# Patient Record
Sex: Male | Born: 1955 | Race: White | Hispanic: No | Smoking: Current every day smoker
Health system: Southern US, Community
[De-identification: ages and names within clinical notes are randomized; demographics above are authoritative.]

## PROBLEM LIST (undated history)

## (undated) DIAGNOSIS — I10 Essential (primary) hypertension: Secondary | ICD-10-CM

## (undated) DIAGNOSIS — E119 Type 2 diabetes mellitus without complications: Secondary | ICD-10-CM

## (undated) DIAGNOSIS — J449 Chronic obstructive pulmonary disease, unspecified: Secondary | ICD-10-CM

---

## 1997-12-24 ENCOUNTER — Ambulatory Visit (HOSPITAL_COMMUNITY): Admission: RE | Admit: 1997-12-24 | Discharge: 1997-12-24 | Payer: Self-pay | Admitting: Internal Medicine

## 1998-10-01 ENCOUNTER — Encounter: Payer: Self-pay | Admitting: Internal Medicine

## 1998-10-01 ENCOUNTER — Ambulatory Visit (HOSPITAL_COMMUNITY): Admission: RE | Admit: 1998-10-01 | Discharge: 1998-10-01 | Payer: Self-pay | Admitting: Internal Medicine

## 2000-03-03 ENCOUNTER — Ambulatory Visit (HOSPITAL_COMMUNITY): Admission: RE | Admit: 2000-03-03 | Discharge: 2000-03-03 | Payer: Self-pay | Admitting: Internal Medicine

## 2000-03-03 ENCOUNTER — Encounter: Payer: Self-pay | Admitting: Internal Medicine

## 2005-03-09 ENCOUNTER — Emergency Department (HOSPITAL_COMMUNITY): Admission: EM | Admit: 2005-03-09 | Discharge: 2005-03-09 | Payer: Self-pay | Admitting: Emergency Medicine

## 2005-03-09 ENCOUNTER — Emergency Department (HOSPITAL_COMMUNITY): Admission: EM | Admit: 2005-03-09 | Discharge: 2005-03-09 | Payer: Self-pay | Admitting: *Deleted

## 2006-05-01 ENCOUNTER — Emergency Department (HOSPITAL_COMMUNITY): Admission: EM | Admit: 2006-05-01 | Discharge: 2006-05-01 | Payer: Self-pay | Admitting: Emergency Medicine

## 2006-06-27 ENCOUNTER — Emergency Department (HOSPITAL_COMMUNITY): Admission: EM | Admit: 2006-06-27 | Discharge: 2006-06-27 | Payer: Self-pay | Admitting: Emergency Medicine

## 2007-07-26 ENCOUNTER — Emergency Department (HOSPITAL_COMMUNITY): Admission: EM | Admit: 2007-07-26 | Discharge: 2007-07-27 | Payer: Self-pay | Admitting: Emergency Medicine

## 2010-02-14 ENCOUNTER — Emergency Department (HOSPITAL_COMMUNITY): Admission: EM | Admit: 2010-02-14 | Discharge: 2010-02-15 | Payer: Self-pay | Admitting: Emergency Medicine

## 2010-02-27 ENCOUNTER — Emergency Department (HOSPITAL_COMMUNITY): Admission: EM | Admit: 2010-02-27 | Discharge: 2010-02-28 | Payer: Self-pay | Admitting: Emergency Medicine

## 2010-03-01 ENCOUNTER — Inpatient Hospital Stay (HOSPITAL_COMMUNITY)
Admission: EM | Admit: 2010-03-01 | Discharge: 2010-03-05 | Disposition: A | Discharge status: Other Acute Inpatient Hospital | Payer: Self-pay | Source: Home / Self Care | Admitting: Emergency Medicine

## 2010-03-05 ENCOUNTER — Inpatient Hospital Stay (HOSPITAL_COMMUNITY): Admission: AD | Admit: 2010-03-05 | Discharge: 2010-03-10 | Payer: Self-pay | Admitting: Psychiatry

## 2010-03-05 ENCOUNTER — Ambulatory Visit: Payer: Self-pay | Admitting: Psychiatry

## 2010-07-30 LAB — CULTURE, BLOOD (ROUTINE X 2): Culture  Setup Time: 201110171953

## 2010-07-30 LAB — CBC
HCT: 35 % — ABNORMAL LOW (ref 39.0–52.0)
HCT: 41.4 % (ref 39.0–52.0)
HCT: 42.7 % (ref 39.0–52.0)
Hemoglobin: 12 g/dL — ABNORMAL LOW (ref 13.0–17.0)
Hemoglobin: 14.9 g/dL (ref 13.0–17.0)
Hemoglobin: 15.3 g/dL (ref 13.0–17.0)
MCH: 30.3 pg (ref 26.0–34.0)
MCH: 30.7 pg (ref 26.0–34.0)
MCH: 31.7 pg (ref 26.0–34.0)
MCHC: 33.3 g/dL (ref 30.0–36.0)
MCHC: 33.3 g/dL (ref 30.0–36.0)
MCHC: 34.3 g/dL (ref 30.0–36.0)
MCHC: 34.5 g/dL (ref 30.0–36.0)
MCV: 90.9 fL (ref 78.0–100.0)
MCV: 92.2 fL (ref 78.0–100.0)
Platelets: 290 10*3/uL (ref 150–400)
Platelets: 347 10*3/uL (ref 150–400)
RBC: 3.86 MIL/uL — ABNORMAL LOW (ref 4.22–5.81)
RBC: 3.87 MIL/uL — ABNORMAL LOW (ref 4.22–5.81)
RBC: 4.79 MIL/uL (ref 4.22–5.81)
RDW: 14.4 % (ref 11.5–15.5)
WBC: 14.8 10*3/uL — ABNORMAL HIGH (ref 4.0–10.5)
WBC: 16.9 10*3/uL — ABNORMAL HIGH (ref 4.0–10.5)
WBC: 9.7 10*3/uL (ref 4.0–10.5)

## 2010-07-30 LAB — COMPREHENSIVE METABOLIC PANEL
ALT: 12 U/L (ref 0–53)
ALT: 19 U/L (ref 0–53)
AST: 39 U/L — ABNORMAL HIGH (ref 0–37)
AST: 59 U/L — ABNORMAL HIGH (ref 0–37)
Albumin: 3.5 g/dL (ref 3.5–5.2)
Alkaline Phosphatase: 69 U/L (ref 39–117)
Alkaline Phosphatase: 73 U/L (ref 39–117)
Alkaline Phosphatase: 55 U/L (ref 39–117)
BUN: 34 mg/dL — ABNORMAL HIGH (ref 6–23)
BUN: 12 mg/dL (ref 6–23)
CO2: 13 mEq/L — ABNORMAL LOW (ref 19–32)
CO2: 22 mEq/L (ref 19–32)
CO2: 26 mEq/L (ref 19–32)
Chloride: 103 mEq/L (ref 96–112)
Chloride: 121 mEq/L — ABNORMAL HIGH (ref 96–112)
Chloride: 107 mEq/L (ref 96–112)
GFR calc Af Amer: >60 mL/min (ref >60)
GFR calc non Af Amer: 42 mL/min — ABNORMAL LOW (ref >60)
GFR calc non Af Amer: >60 mL/min (ref >60)
Glucose, Bld: 109 mg/dL — ABNORMAL HIGH (ref 70–99)
Glucose, Bld: 128 mg/dL — ABNORMAL HIGH (ref 70–99)
Potassium: 3.5 mEq/L (ref 3.5–5.1)
Potassium: 4.6 mEq/L (ref 3.5–5.1)
Potassium: 3.2 mEq/L — ABNORMAL LOW (ref 3.5–5.1)
Sodium: 135 mEq/L (ref 135–145)
Sodium: 150 mEq/L — ABNORMAL HIGH (ref 135–145)
Total Bilirubin: 1.1 mg/dL (ref 0.3–1.2)
Total Bilirubin: 0.4 mg/dL (ref 0.3–1.2)
Total Protein: 8.3 g/dL (ref 6.0–8.3)

## 2010-07-30 LAB — DIFFERENTIAL
Basophils Absolute: 0 10*3/uL (ref 0.0–0.1)
Basophils Absolute: 0 10*3/uL (ref 0.0–0.1)
Basophils Relative: 0 % (ref 0–1)
Basophils Relative: 0 % (ref 0–1)
Eosinophils Absolute: 0 10*3/uL (ref 0.0–0.7)
Eosinophils Absolute: 0 10*3/uL (ref 0.0–0.7)
Eosinophils Relative: 0 % (ref 0–5)
Eosinophils Relative: 0 % (ref 0–5)
Eosinophils Relative: 1 % (ref 0–5)
Lymphocytes Relative: 8 % — ABNORMAL LOW (ref 12–46)
Lymphs Abs: 4.6 10*3/uL — ABNORMAL HIGH (ref 0.7–4.0)
Monocytes Absolute: 1.3 10*3/uL — ABNORMAL HIGH (ref 0.1–1.0)
Monocytes Absolute: 1.3 10*3/uL — ABNORMAL HIGH (ref 0.1–1.0)
Monocytes Relative: 8 % (ref 3–12)
Neutro Abs: 10.3 10*3/uL — ABNORMAL HIGH (ref 1.7–7.7)
Neutrophils Relative %: 78 % — ABNORMAL HIGH (ref 43–77)

## 2010-07-30 LAB — CARDIAC PANEL(CRET KIN+CKTOT+MB+TROPI)
CK, MB: 28 ng/mL (ref 0.3–4.0)
Relative Index: 1.6 (ref 0.0–2.5)
Total CK: 1756 U/L — ABNORMAL HIGH (ref 7–232)

## 2010-07-30 LAB — URINE DRUGS OF ABUSE SCREEN W ALC, ROUTINE (REF LAB)
Benzodiazepines.: NEGATIVE
Cocaine Metabolites: NEGATIVE
Creatinine,U: 162.1 mg/dL
Ethyl Alcohol: <10 mg/dL (ref <10)
Opiate Screen, Urine: NEGATIVE
Phencyclidine (PCP): NEGATIVE

## 2010-07-30 LAB — SALICYLATE LEVEL: Salicylate Lvl: <4 mg/dL (ref 2.8–20.0)

## 2010-07-30 LAB — GLUCOSE, CAPILLARY
Glucose-Capillary: 103 mg/dL — ABNORMAL HIGH (ref 70–99)
Glucose-Capillary: 107 mg/dL — ABNORMAL HIGH (ref 70–99)
Glucose-Capillary: 110 mg/dL — ABNORMAL HIGH (ref 70–99)
Glucose-Capillary: 127 mg/dL — ABNORMAL HIGH (ref 70–99)
Glucose-Capillary: 145 mg/dL — ABNORMAL HIGH (ref 70–99)
Glucose-Capillary: 163 mg/dL — ABNORMAL HIGH (ref 70–99)

## 2010-07-30 LAB — URINALYSIS, ROUTINE W REFLEX MICROSCOPIC
Glucose, UA: NEGATIVE mg/dL
Hgb urine dipstick: NEGATIVE
Protein, ur: NEGATIVE mg/dL
Specific Gravity, Urine: 1.016 (ref 1.005–1.030)
Urobilinogen, UA: 0.2 mg/dL (ref 0.0–1.0)

## 2010-07-30 LAB — BASIC METABOLIC PANEL
BUN: 6 mg/dL (ref 6–23)
CO2: 24 mEq/L (ref 19–32)
Calcium: 8.3 mg/dL — ABNORMAL LOW (ref 8.4–10.5)
Chloride: 107 mEq/L (ref 96–112)
Chloride: 111 mEq/L (ref 96–112)
Creatinine, Ser: 0.79 mg/dL (ref 0.4–1.5)
Creatinine, Ser: 0.96 mg/dL (ref 0.4–1.5)
GFR calc non Af Amer: >60 mL/min (ref >60)
GFR calc non Af Amer: >60 mL/min (ref >60)
Glucose, Bld: 114 mg/dL — ABNORMAL HIGH (ref 70–99)
Glucose, Bld: 90 mg/dL (ref 70–99)
Potassium: 3 mEq/L — ABNORMAL LOW (ref 3.5–5.1)
Potassium: 3.2 mEq/L — ABNORMAL LOW (ref 3.5–5.1)
Sodium: 138 mEq/L (ref 135–145)

## 2010-07-30 LAB — RAPID URINE DRUG SCREEN, HOSP PERFORMED
Amphetamines: NOT DETECTED
Amphetamines: NOT DETECTED
Barbiturates: NOT DETECTED
Barbiturates: NOT DETECTED
Benzodiazepines: POSITIVE — AB
Tetrahydrocannabinol: NOT DETECTED

## 2010-07-30 LAB — VITAMIN B12: Vitamin B-12: 340 pg/mL (ref 211–911)

## 2010-07-30 LAB — CK TOTAL AND CKMB (NOT AT ARMC)
CK, MB: 25.2 ng/mL (ref 0.3–4.0)
Relative Index: 2.5 (ref 0.0–2.5)
Total CK: 1180 U/L — ABNORMAL HIGH (ref 7–232)

## 2010-07-30 LAB — RPR: RPR Ser Ql: NONREACTIVE

## 2010-07-30 LAB — TSH: TSH: 1.132 u[IU]/mL (ref 0.350–4.500)

## 2010-07-30 LAB — CK: Total CK: 1622 U/L — ABNORMAL HIGH (ref 7–232)

## 2010-07-30 LAB — GAMMA GT: GGT: 27 U/L (ref 7–51)

## 2010-07-30 LAB — ETHANOL: Alcohol, Ethyl (B): <5 mg/dL (ref 0–10)

## 2011-02-09 LAB — COMPREHENSIVE METABOLIC PANEL
BUN: 6
CO2: 28
Calcium: 9.2
Creatinine, Ser: 0.94
GFR calc non Af Amer: >60
Glucose, Bld: 101 — ABNORMAL HIGH

## 2011-02-09 LAB — URINE MICROSCOPIC-ADD ON

## 2011-02-09 LAB — URINALYSIS, ROUTINE W REFLEX MICROSCOPIC
Leukocytes, UA: NEGATIVE
Nitrite: NEGATIVE
Specific Gravity, Urine: 1.019
Urobilinogen, UA: 0.2

## 2011-12-11 ENCOUNTER — Emergency Department (HOSPITAL_BASED_OUTPATIENT_CLINIC_OR_DEPARTMENT_OTHER): Payer: Medicaid Other

## 2011-12-11 ENCOUNTER — Encounter (HOSPITAL_BASED_OUTPATIENT_CLINIC_OR_DEPARTMENT_OTHER): Payer: Self-pay | Admitting: Emergency Medicine

## 2011-12-11 ENCOUNTER — Emergency Department (HOSPITAL_BASED_OUTPATIENT_CLINIC_OR_DEPARTMENT_OTHER)
Admission: EM | Admit: 2011-12-11 | Discharge: 2011-12-11 | Disposition: A | Discharge status: Home / Self Care | Payer: Medicaid Other | Attending: Emergency Medicine | Admitting: Emergency Medicine

## 2011-12-11 DIAGNOSIS — Y92410 Unspecified street and highway as the place of occurrence of the external cause: Secondary | ICD-10-CM | POA: Insufficient documentation

## 2011-12-11 DIAGNOSIS — R413 Other amnesia: Secondary | ICD-10-CM | POA: Insufficient documentation

## 2011-12-11 DIAGNOSIS — F17200 Nicotine dependence, unspecified, uncomplicated: Secondary | ICD-10-CM | POA: Insufficient documentation

## 2011-12-11 DIAGNOSIS — S060X9A Concussion with loss of consciousness of unspecified duration, initial encounter: Secondary | ICD-10-CM

## 2011-12-11 DIAGNOSIS — S060XAA Concussion with loss of consciousness status unknown, initial encounter: Secondary | ICD-10-CM | POA: Insufficient documentation

## 2011-12-11 DIAGNOSIS — I10 Essential (primary) hypertension: Secondary | ICD-10-CM | POA: Insufficient documentation

## 2011-12-11 HISTORY — DX: Essential (primary) hypertension: I10

## 2011-12-11 LAB — CBC WITH DIFFERENTIAL/PLATELET
Basophils Relative: 1 % (ref 0–1)
Eosinophils Absolute: 0.2 10*3/uL (ref 0.0–0.7)
Eosinophils Relative: 1 % (ref 0–5)
Hemoglobin: 11.9 g/dL — ABNORMAL LOW (ref 13.0–17.0)
MCH: 30.2 pg (ref 26.0–34.0)
MCHC: 34.1 g/dL (ref 30.0–36.0)
MCV: 88.6 fL (ref 78.0–100.0)
Monocytes Relative: 10 % (ref 3–12)
Neutrophils Relative %: 65 % (ref 43–77)

## 2011-12-11 LAB — RAPID URINE DRUG SCREEN, HOSP PERFORMED
Barbiturates: NOT DETECTED
Cocaine: NOT DETECTED
Tetrahydrocannabinol: NOT DETECTED

## 2011-12-11 LAB — COMPREHENSIVE METABOLIC PANEL
ALT: 9 U/L (ref 0–53)
AST: 29 U/L (ref 0–37)
Albumin: 3.6 g/dL (ref 3.5–5.2)
CO2: 29 mEq/L (ref 19–32)
Chloride: 98 mEq/L (ref 96–112)
GFR calc non Af Amer: 59 mL/min — ABNORMAL LOW (ref >90)
Sodium: 137 mEq/L (ref 135–145)
Total Bilirubin: 0.3 mg/dL (ref 0.3–1.2)

## 2011-12-11 LAB — AMMONIA: Ammonia: 49 umol/L (ref 11–60)

## 2011-12-11 LAB — ETHANOL: Alcohol, Ethyl (B): <11 mg/dL (ref 0–11)

## 2011-12-11 MED ORDER — SODIUM CHLORIDE 0.9 % IV SOLN
1000.0000 mL | INTRAVENOUS | Status: DC
  Administered 2011-12-11: 1000 mL via INTRAVENOUS

## 2011-12-11 NOTE — ED Notes (Signed)
MD at bedside.-Dr. Knapp 
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
## 2013-01-24 ENCOUNTER — Emergency Department (HOSPITAL_COMMUNITY)
Admission: EM | Admit: 2013-01-24 | Discharge: 2013-01-24 | Disposition: A | Discharge status: Home / Self Care | Payer: Medicaid Other | Source: Home / Self Care | Attending: Family Medicine | Admitting: Family Medicine

## 2013-01-24 ENCOUNTER — Encounter (HOSPITAL_COMMUNITY): Payer: Self-pay | Admitting: Emergency Medicine

## 2013-01-24 ENCOUNTER — Emergency Department (INDEPENDENT_AMBULATORY_CARE_PROVIDER_SITE_OTHER): Payer: Medicaid Other

## 2013-01-24 DIAGNOSIS — S92919A Unspecified fracture of unspecified toe(s), initial encounter for closed fracture: Secondary | ICD-10-CM

## 2013-01-24 DIAGNOSIS — S92911A Unspecified fracture of right toe(s), initial encounter for closed fracture: Secondary | ICD-10-CM

## 2013-01-24 MED ORDER — HYDROCODONE-ACETAMINOPHEN 5-325 MG PO TABS: 1.0000 | ORAL_TABLET | Freq: Four times a day (QID) | ORAL | Status: DC | PRN

## 2013-01-24 NOTE — ED Provider Notes (Addendum)
CSN: [id number]     Arrival date & time 01/24/13  1001 History   None    Chief Complaint  Patient presents with  . Foot Injury    injury to right great toe 3 a.m this morning.    (Consider location/radiation/quality/duration/timing/severity/associated sxs/prior Treatment) Patient is a 57 y.o. male presenting with foot injury. The history is provided by the patient.  Foot Injury Location:  Toe Time since incident:  7 hours Injury: yes   Mechanism of injury: crush   Crush injury:    Mechanism: dropped heavy glass table on toe. Toe location:  R big toe Pain details:    Quality:  Throbbing   Severity:  Moderate Chronicity:  New Dislocation: no   Foreign body present:  No foreign bodies Tetanus status:  Up to date   Past Medical History  Diagnosis Date  . Hypertension    History reviewed. No pertinent past surgical history. History reviewed. No pertinent family history. History  Substance Use Topics  . Smoking status: Current Every Day Smoker -- 1.00 packs/day  . Smokeless tobacco: Not on file  . Alcohol Use: No    Review of Systems  Constitutional: Negative.   Musculoskeletal: Positive for joint swelling and gait problem.  Skin: Positive for wound.    Allergies  Review of patient's allergies indicates no known allergies.  Home Medications   Current Outpatient Rx  Name  Route  Sig  Dispense  Refill  . HYDROcodone-acetaminophen (NORCO/VICODIN) 5-325 MG per tablet   Oral   Take 1 tablet by mouth every 6 (six) hours as needed for pain.   6 tablet   1    BP 129/87  Pulse 88  Temp(Src) 98.2 F (36.8 C) (Oral)  Resp 20  SpO2 95% Physical Exam  Nursing note and vitals reviewed. Constitutional: He is oriented to person, place, and time. He appears well-developed and well-nourished.  Musculoskeletal: He exhibits tenderness.       Feet:  Neurological: He is alert and oriented to person, place, and time.  Skin: Skin is warm and dry.    ED Course  Procedures  (including critical care time) Labs Review Labs Reviewed - No data to display Imaging Review Dg Foot Complete Right  01/24/2013   *RADIOLOGY REPORT*  Clinical Data: Pain post trauma  RIGHT FOOT COMPLETE - 3+ VIEW  Comparison: None.  Findings: Frontal, oblique, lateral views were obtained.  There is an obliquely oriented fracture along the medial proximal portion of the first distal phalanx in near anatomic alignment.  This obliquely oriented fracture does extend into the first IP joint. There is also a similar appearing fracture along the lateral aspect of the proximal portion of the first distal phalanx in near anatomic alignment.  There are no other fractures.  No dislocation. There is moderate narrowing of all PIP and DIP joints.  There is a spur arising from the inferior calcaneus.  There is a small exostosis arising from the dorsal distal talus.  IMPRESSION: Fractures of the medial and lateral aspects of the proximal portion of the first distal phalanx.  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  Inferior calcaneal spur.  No dislocation.   Original Report Authenticated By: Bretta Bang, M.D.    MDM  X-rays reviewed and report per radiologist.     Linna Hoff, MD 01/24/13 1124  Linna Hoff, MD 01/25/13 2036

## 2013-01-24 NOTE — ED Notes (Signed)
Reports dropping glass table top on right foot. Having pain in right great toe/brusing and ankle swelling.  Incident happened 3 a.m this morning. Pt has not taking any otc meds for pain.

## 2013-01-24 NOTE — ED Notes (Signed)
Pt is up to date on tdap 

## 2013-06-25 ENCOUNTER — Telehealth (HOSPITAL_COMMUNITY): Payer: Self-pay | Admitting: *Deleted

## 2013-06-25 ENCOUNTER — Emergency Department (HOSPITAL_COMMUNITY)
Admission: EM | Admit: 2013-06-25 | Discharge: 2013-06-25 | Disposition: A | Discharge status: Home / Self Care | Payer: Medicaid Other | Attending: Emergency Medicine | Admitting: Emergency Medicine

## 2013-06-25 DIAGNOSIS — F19939 Other psychoactive substance use, unspecified with withdrawal, unspecified: Principal | ICD-10-CM | POA: Insufficient documentation

## 2013-06-25 DIAGNOSIS — R61 Generalized hyperhidrosis: Secondary | ICD-10-CM | POA: Insufficient documentation

## 2013-06-25 DIAGNOSIS — F17200 Nicotine dependence, unspecified, uncomplicated: Secondary | ICD-10-CM | POA: Insufficient documentation

## 2013-06-25 DIAGNOSIS — F411 Generalized anxiety disorder: Secondary | ICD-10-CM | POA: Insufficient documentation

## 2013-06-25 DIAGNOSIS — Z79899 Other long term (current) drug therapy: Secondary | ICD-10-CM | POA: Insufficient documentation

## 2013-06-25 DIAGNOSIS — F1193 Opioid use, unspecified with withdrawal: Secondary | ICD-10-CM

## 2013-06-25 DIAGNOSIS — I10 Essential (primary) hypertension: Secondary | ICD-10-CM | POA: Insufficient documentation

## 2013-06-25 DIAGNOSIS — R000 Tachycardia, unspecified: Secondary | ICD-10-CM | POA: Insufficient documentation

## 2013-06-25 DIAGNOSIS — R1084 Generalized abdominal pain: Secondary | ICD-10-CM | POA: Insufficient documentation

## 2013-06-25 DIAGNOSIS — F13939 Sedative, hypnotic or anxiolytic use, unspecified with withdrawal, unspecified: Secondary | ICD-10-CM

## 2013-06-25 DIAGNOSIS — R197 Diarrhea, unspecified: Secondary | ICD-10-CM | POA: Insufficient documentation

## 2013-06-25 DIAGNOSIS — F1123 Opioid dependence with withdrawal: Secondary | ICD-10-CM

## 2013-06-25 DIAGNOSIS — F13239 Sedative, hypnotic or anxiolytic dependence with withdrawal, unspecified: Secondary | ICD-10-CM

## 2013-06-25 MED ORDER — ALPRAZOLAM 1 MG PO TABS: 1.0000 mg | ORAL_TABLET | Freq: Two times a day (BID) | ORAL | Status: DC

## 2013-06-25 MED ORDER — SODIUM CHLORIDE 0.9 % IV BOLUS (SEPSIS)
1000.0000 mL | Freq: Once | INTRAVENOUS | Status: AC
  Administered 2013-06-25: 1000 mL via INTRAVENOUS

## 2013-06-25 MED ORDER — ALPRAZOLAM 0.5 MG PO TABS
1.0000 mg | ORAL_TABLET | Freq: Once | ORAL | Status: AC
  Administered 2013-06-25: 1 mg via ORAL
  Filled 2013-06-25: qty 4

## 2013-06-25 MED ORDER — ONDANSETRON HCL 4 MG PO TABS: 4.0000 mg | ORAL_TABLET | Freq: Four times a day (QID) | ORAL | Status: DC

## 2013-06-25 MED ORDER — CLONIDINE HCL 0.2 MG PO TABS: 0.2000 mg | ORAL_TABLET | Freq: Two times a day (BID) | ORAL | Status: DC

## 2013-06-25 MED ORDER — ONDANSETRON HCL 4 MG/2ML IJ SOLN
4.0000 mg | Freq: Once | INTRAMUSCULAR | Status: AC
  Administered 2013-06-25: 4 mg via INTRAVENOUS
  Filled 2013-06-25: qty 2

## 2013-06-25 MED ORDER — CLONIDINE HCL 0.1 MG PO TABS
0.2000 mg | ORAL_TABLET | Freq: Once | ORAL | Status: AC
  Administered 2013-06-25: 0.2 mg via ORAL
  Filled 2013-06-25: qty 2
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
## 2013-06-25 NOTE — Discharge Instructions (Signed)
Benzodiazepine Withdrawal  °Benzodiazepines are a group of drugs that are prescribed for both short-term and long-term treatment of a variety of medical conditions. For some of these conditions, such as seizures and sudden and severe muscle spasms, they are used only for a few hours or a few days. For other conditions, such as anxiety, sleep problems, or frequent muscle spasms or to help prevent seizures, they are used for an extended period, usually weeks or months. °Benzodiazepines work by changing the way your brain functions. Normally, chemicals in your brain called neurotransmitters send messages between your brain cells. The neurotransmitter that benzodiazepines affect is called gamma-aminobutyric acid (GABA). GABA sends out messages that have a calming effect on many of the functions of your brain. Benzodiazepines make these messages stronger and increase this calming effect. °Short-term use of benzodiazepines usually does not cause problems when you stop taking the drugs. However, if you take benzodiazepines for a long time, your body can adjust to the drug and require more of it to produce the same effect (drug tolerance). Eventually, you can develop physical dependence on benzodiazepines, which is when you experience negative effects if your dosage of benzodiazepines is reduced or stopped too quickly. These negative effects are called symptoms of withdrawal. °SYMPTOMS °Symptoms of withdrawal may begin anytime within the first 10 days after you stop taking the benzodiazepine. They can last from several weeks up to a few months but usually are the worst between the first 10 to 14 days.  °The actual symptoms also vary, depending on the type of benzodiazepine you take. Possible symptoms include: °· Anxiety. °· Excitability. °· Irritability. °· Depression. °· Mood swings. °· Trouble sleeping. °· Confusion. °· Uncontrollable shaking (tremors). °· Muscle weakness. °· Seizures. °DIAGNOSIS °To diagnose  benzodiazepine withdrawal, your caregiver will examine you for certain signs, such as: °· Rapid heartbeat. °· Rapid breathing. °· Tremors. °· High blood pressure. °· Fever. °· Mood changes. °Your caregiver also may ask the following questions about your use of benzodiazepines: °· What type of benzodiazepine did you take? °· How much did you take each day? °· How long did you take the drug? °· When was the last time you took the drug? °· Do you take any other drugs? °· Have you had alcohol recently? °· Have you had a seizure recently? °· Have you lost consciousness recently? °· Have you had trouble remembering recent events? °· Have you had a recent increase in anxiety, irritability, or trouble sleeping? °A drug test also may be administered. °TREATMENT °The treatment for benzodiazepine withdrawal can vary, depending on the type and severity of your symptoms, what type of benzodiazepine you have been taking, and how long you have been taking the benzodiazepine. Sometimes it is necessary for you to be treated in a hospital, especially if you are at risk of seizures.  °Often, treatment includes a prescription for a long-acting benzodiazepine, the dosage of which is reduced slowly over a long period. This period could be several weeks or months. Eventually, your dosage will be reduced to a point that you can stop taking the drug, without experiencing withdrawal symptoms. This is called tapered withdrawal. Occasionally, minor symptoms of withdrawal continue for a few days or weeks after you have completed a tapered withdrawal. °SEEK IMMEDIATE MEDICAL CARE IF: °· You have a seizure. °· You develop a craving for drugs or alcohol. °· You begin to experience symptoms of withdrawal during your tapered withdrawal. °· You become very confused. °· You lose consciousness. °· You   have trouble breathing.  You think about hurting yourself or someone else. Document Released: 04/23/2011 Document Revised: 07/27/2011 Document  Reviewed: 04/23/2011 [facility] Patient Information 2014 [place], [place]. Narcotic Withdrawal If you take narcotic drugs for a long time, you may become dependent on them. Stopping these medicines suddenly can cause physical symptoms of withdrawal. Narcotics include opiate prescription pain medicines and heroin. Commonly prescribed narcotics include codeine, hydrocodone, oxycodone, methadone, and morphine. SYMPTOMS  Narcotics tend to slow down body and mental function. When you quit taking narcotics, your body and mind are stimulated. Some withdrawal symptoms include:  Irritability.  Anxiety.  Runny nose.  "Goose flesh."  Diarrhea.  Nausea.  Muscle spasms.  Sleeplessness.  Chills.  Sweats.  Drug cravings.  Confusion. Withdrawal symptoms are troubling. The severity depends on:  Your body's make up.  The amount of drugs you used.  The length of time you used them. You may be at greater risk of having twitching and shaking (seizure) during the first several days of withdrawal from sedative drugs, including narcotics. However, opiate withdrawal rarely causes a seizure. Withdrawal is uncomfortable, but it is not life-threatening for adults unless there is a medical complication, such as heart disease. HOME CARE INSTRUCTIONS   Drink fluids, get plenty of rest, and take hot baths.  Medicines may be prescribed to help control withdrawal symptoms.  Over-the-counter medicines may be helpful to control diarrhea or an upset stomach.  If your problems resulted from taking prescription pain medicines, make sure you have a follow-up visit with your caregiver within the next few days. Be open about this problem.  If you are dependent or addicted to street drugs, contact a local drug and alcohol treatment center or Narcotics Anonymous.  Have someone with you to monitor your symptoms.  Engage in healthy activities with friends who do not use drugs.  Stay away from the drug  scene. SEEK IMMEDIATE MEDICAL CARE IF:   You have vomiting that cannot be controlled, especially if you cannot keep liquids down.  You are seeing things or hearing voices that are not really there (hallucinating).  You have a seizure. Document Released: 06/11/2004 Document Revised: 07/27/2011 Document Reviewed: 10/04/2009 [facility] Patient Information 2014 [place], [place].
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
## 2016-01-29 ENCOUNTER — Emergency Department (HOSPITAL_COMMUNITY): Payer: Medicaid Other

## 2016-01-29 ENCOUNTER — Emergency Department (HOSPITAL_COMMUNITY)
Admission: EM | Admit: 2016-01-29 | Discharge: 2016-01-29 | Disposition: A | Discharge status: Home / Self Care | Payer: Medicaid Other | Attending: Emergency Medicine | Admitting: Emergency Medicine

## 2016-01-29 ENCOUNTER — Encounter (HOSPITAL_COMMUNITY): Payer: Self-pay | Admitting: *Deleted

## 2016-01-29 DIAGNOSIS — Z7984 Long term (current) use of oral hypoglycemic drugs: Secondary | ICD-10-CM | POA: Insufficient documentation

## 2016-01-29 DIAGNOSIS — G8929 Other chronic pain: Secondary | ICD-10-CM | POA: Diagnosis not present

## 2016-01-29 DIAGNOSIS — Z79899 Other long term (current) drug therapy: Secondary | ICD-10-CM | POA: Insufficient documentation

## 2016-01-29 DIAGNOSIS — M25562 Pain in left knee: Secondary | ICD-10-CM | POA: Insufficient documentation

## 2016-01-29 DIAGNOSIS — M545 Low back pain: Secondary | ICD-10-CM | POA: Diagnosis present

## 2016-01-29 DIAGNOSIS — F17210 Nicotine dependence, cigarettes, uncomplicated: Secondary | ICD-10-CM | POA: Insufficient documentation

## 2016-01-29 DIAGNOSIS — I10 Essential (primary) hypertension: Secondary | ICD-10-CM | POA: Diagnosis not present

## 2016-01-29 DIAGNOSIS — R200 Anesthesia of skin: Secondary | ICD-10-CM | POA: Insufficient documentation

## 2016-01-29 DIAGNOSIS — M5136 Other intervertebral disc degeneration, lumbar region: Secondary | ICD-10-CM | POA: Diagnosis not present

## 2016-01-29 NOTE — ED Notes (Signed)
Please see triage note

## 2016-01-29 NOTE — ED Notes (Signed)
Pt left without his dc instructions

## 2016-01-29 NOTE — ED Triage Notes (Addendum)
Pt reports L leg pain and swelling and back pain for more than 2 weeks.  States he went to see his doctor Monday-was given a referral to a diff doctor for an xray.  Still waiting for a call from them.  Pt reports the swelling in his L leg is more in his knee.  Pt ambulates with a cane without a difficulty.  Was able to lift both legs up onto the stretcher to lay down.  Pt states his knee feels sore also
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
## 2016-01-29 NOTE — ED Notes (Signed)
Patient transported to X-ray 

## 2016-02-08 ENCOUNTER — Emergency Department (HOSPITAL_COMMUNITY)
Admission: EM | Admit: 2016-02-08 | Discharge: 2016-02-08 | Disposition: A | Discharge status: Home / Self Care | Payer: Medicaid Other | Attending: Emergency Medicine | Admitting: Emergency Medicine

## 2016-02-08 ENCOUNTER — Encounter (HOSPITAL_COMMUNITY): Payer: Self-pay

## 2016-02-08 DIAGNOSIS — Z7984 Long term (current) use of oral hypoglycemic drugs: Secondary | ICD-10-CM | POA: Diagnosis not present

## 2016-02-08 DIAGNOSIS — I10 Essential (primary) hypertension: Secondary | ICD-10-CM | POA: Insufficient documentation

## 2016-02-08 DIAGNOSIS — T2122XA Burn of second degree of abdominal wall, initial encounter: Secondary | ICD-10-CM | POA: Insufficient documentation

## 2016-02-08 DIAGNOSIS — T2121XA Burn of second degree of chest wall, initial encounter: Secondary | ICD-10-CM | POA: Diagnosis not present

## 2016-02-08 DIAGNOSIS — F17210 Nicotine dependence, cigarettes, uncomplicated: Secondary | ICD-10-CM | POA: Insufficient documentation

## 2016-02-08 DIAGNOSIS — Y990 Civilian activity done for income or pay: Secondary | ICD-10-CM | POA: Diagnosis not present

## 2016-02-08 DIAGNOSIS — T22231A Burn of second degree of right upper arm, initial encounter: Secondary | ICD-10-CM | POA: Diagnosis not present

## 2016-02-08 DIAGNOSIS — T2000XA Burn of unspecified degree of head, face, and neck, unspecified site, initial encounter: Secondary | ICD-10-CM | POA: Insufficient documentation

## 2016-02-08 DIAGNOSIS — Y9389 Activity, other specified: Secondary | ICD-10-CM | POA: Diagnosis not present

## 2016-02-08 DIAGNOSIS — X12XXXA Contact with other hot fluids, initial encounter: Secondary | ICD-10-CM | POA: Diagnosis not present

## 2016-02-08 DIAGNOSIS — Y92009 Unspecified place in unspecified non-institutional (private) residence as the place of occurrence of the external cause: Secondary | ICD-10-CM | POA: Diagnosis not present

## 2016-02-08 DIAGNOSIS — IMO0002 Reserved for concepts with insufficient information to code with codable children: Secondary | ICD-10-CM

## 2016-02-08 MED ORDER — BACITRACIN ZINC 500 UNIT/GM EX OINT: 1.0000 "application " | TOPICAL_OINTMENT | Freq: Two times a day (BID) | CUTANEOUS | 0 refills | Status: DC

## 2016-02-08 MED ORDER — FLUORESCEIN SODIUM 1 MG OP STRP
1.0000 | ORAL_STRIP | Freq: Once | OPHTHALMIC | Status: AC
  Administered 2016-02-08: 1 via OPHTHALMIC
  Filled 2016-02-08: qty 1

## 2016-02-08 MED ORDER — SILVER SULFADIAZINE 1 % EX CREA
TOPICAL_CREAM | Freq: Once | CUTANEOUS | Status: AC
  Administered 2016-02-08: 11:00:00 via TOPICAL
  Filled 2016-02-08: qty 85

## 2016-02-08 MED ORDER — SILVER SULFADIAZINE 1 % EX CREA: 1.0000 "application " | TOPICAL_CREAM | Freq: Two times a day (BID) | CUTANEOUS | 0 refills | Status: AC

## 2016-02-08 MED ORDER — BACITRACIN ZINC 500 UNIT/GM EX OINT
TOPICAL_OINTMENT | Freq: Once | CUTANEOUS | Status: AC
  Administered 2016-02-08: 1 via TOPICAL
  Filled 2016-02-08: qty 0.9
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
## 2016-02-08 NOTE — ED Notes (Signed)
Pt A&OX4, ambulatory at d/c with steady gait. Verbalized understanding of wound care and follow up care.

## 2016-03-22 ENCOUNTER — Encounter (HOSPITAL_COMMUNITY): Payer: Self-pay | Admitting: Emergency Medicine

## 2016-03-22 ENCOUNTER — Emergency Department (HOSPITAL_COMMUNITY)
Admission: EM | Admit: 2016-03-22 | Discharge: 2016-03-22 | Disposition: A | Discharge status: Home / Self Care | Payer: Medicaid Other | Attending: Emergency Medicine | Admitting: Emergency Medicine

## 2016-03-22 DIAGNOSIS — F17210 Nicotine dependence, cigarettes, uncomplicated: Secondary | ICD-10-CM | POA: Insufficient documentation

## 2016-03-22 DIAGNOSIS — F419 Anxiety disorder, unspecified: Secondary | ICD-10-CM | POA: Diagnosis not present

## 2016-03-22 DIAGNOSIS — Z79899 Other long term (current) drug therapy: Secondary | ICD-10-CM | POA: Insufficient documentation

## 2016-03-22 DIAGNOSIS — I10 Essential (primary) hypertension: Secondary | ICD-10-CM | POA: Diagnosis not present

## 2016-03-22 DIAGNOSIS — R440 Auditory hallucinations: Secondary | ICD-10-CM | POA: Insufficient documentation

## 2016-03-22 DIAGNOSIS — R443 Hallucinations, unspecified: Secondary | ICD-10-CM

## 2016-03-22 DIAGNOSIS — Z7984 Long term (current) use of oral hypoglycemic drugs: Secondary | ICD-10-CM | POA: Diagnosis not present

## 2016-03-22 MED ORDER — AMLODIPINE BESYLATE 5 MG PO TABS: 10.0000 mg | ORAL_TABLET | Freq: Every day | ORAL | Status: DC

## 2016-03-22 MED ORDER — HYDROXYZINE HCL 25 MG PO TABS: 25.0000 mg | ORAL_TABLET | Freq: Four times a day (QID) | ORAL | Status: DC | PRN

## 2016-03-22 MED ORDER — LORAZEPAM 1 MG PO TABS
1.0000 mg | ORAL_TABLET | Freq: Once | ORAL | Status: AC
  Administered 2016-03-22: 1 mg via ORAL
  Filled 2016-03-22: qty 1

## 2016-03-22 MED ORDER — METFORMIN HCL ER 500 MG PO TB24
500.0000 mg | ORAL_TABLET | Freq: Every day | ORAL | Status: DC
  Administered 2016-03-22: 500 mg via ORAL
  Filled 2016-03-22: qty 1

## 2016-03-22 MED ORDER — HYDROXYZINE HCL 25 MG PO TABS: 25.0000 mg | ORAL_TABLET | Freq: Four times a day (QID) | ORAL | 0 refills | Status: DC | PRN

## 2016-03-22 MED ORDER — CLONIDINE HCL 0.1 MG PO TABS: 0.2000 mg | ORAL_TABLET | Freq: Two times a day (BID) | ORAL | Status: DC

## 2016-03-22 NOTE — ED Notes (Signed)
Bed: WA29 Expected date:  Expected time:  Means of arrival:  Comments: 

## 2016-03-22 NOTE — ED Triage Notes (Addendum)
Per EMS- Pt has been having hallucinations. He is aware that these are hallucinations and not real. He hasn't been on his Xanax since last Saturday. He has a history of TBI. Denies SI or HI.

## 2016-03-22 NOTE — ED Notes (Signed)
Contacted lab and they have no blood for this patient.
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
## 2016-03-22 NOTE — Progress Notes (Signed)
CSW spoke with patient at bedside. CSW informed patient that psychiatrist recommended that patient follow up with outpatient treatment. CSW provided patient with outpatient resource handout. CSW inquired if patient had any additional questions, patient replied no.

## 2016-03-22 NOTE — ED Notes (Signed)
Pt medically cleared after Dr. Randal Buba received lab results

## 2016-03-22 NOTE — Progress Notes (Signed)
Patient denies suicidal/homicidal ideations or alcohol/drug abuse.  He reports his hallucinations stopped when he came to the ED, vague auditory hallucinations prior to admission.  Mr. Douglas Melendez ran out of his Xanax over a week ago which he reports he has taken for over twenty years but cannot remember his MD's name.  Moved from [place] two [place] half years ago and has a MD but cannot remember his name or diagnoses.  Stable for discharge.  Waylan Boga, PMH-NP
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
## 2016-03-23 ENCOUNTER — Emergency Department (HOSPITAL_COMMUNITY)
Admission: EM | Admit: 2016-03-23 | Discharge: 2016-03-24 | Disposition: A | Discharge status: Home / Self Care | Payer: Medicaid Other | Attending: Emergency Medicine | Admitting: Emergency Medicine

## 2016-03-23 ENCOUNTER — Encounter (HOSPITAL_COMMUNITY): Payer: Self-pay | Admitting: Emergency Medicine

## 2016-03-23 DIAGNOSIS — I10 Essential (primary) hypertension: Secondary | ICD-10-CM | POA: Insufficient documentation

## 2016-03-23 DIAGNOSIS — Z7984 Long term (current) use of oral hypoglycemic drugs: Secondary | ICD-10-CM | POA: Insufficient documentation

## 2016-03-23 DIAGNOSIS — F17210 Nicotine dependence, cigarettes, uncomplicated: Secondary | ICD-10-CM | POA: Insufficient documentation

## 2016-03-23 DIAGNOSIS — F19982 Other psychoactive substance use, unspecified with psychoactive substance-induced sleep disorder: Secondary | ICD-10-CM

## 2016-03-23 DIAGNOSIS — F10239 Alcohol dependence with withdrawal, unspecified: Secondary | ICD-10-CM | POA: Diagnosis present

## 2016-03-23 DIAGNOSIS — F10982 Alcohol use, unspecified with alcohol-induced sleep disorder: Secondary | ICD-10-CM | POA: Insufficient documentation

## 2016-03-23 DIAGNOSIS — E876 Hypokalemia: Secondary | ICD-10-CM | POA: Insufficient documentation

## 2016-03-23 LAB — CBC WITH DIFFERENTIAL/PLATELET
BASOS ABS: 0.1 10*3/uL (ref 0.0–0.1)
BASOS PCT: 0 %
Eosinophils Absolute: 0 10*3/uL (ref 0.0–0.7)
Eosinophils Relative: 0 %
HEMATOCRIT: 45.8 % (ref 39.0–52.0)
HEMOGLOBIN: 16.2 g/dL (ref 13.0–17.0)
LYMPHS PCT: 27 %
Lymphs Abs: 4 10*3/uL (ref 0.7–4.0)
MCH: 31.2 pg (ref 26.0–34.0)
MCHC: 35.4 g/dL (ref 30.0–36.0)
MCV: 88.2 fL (ref 78.0–100.0)
MONO ABS: 1 10*3/uL (ref 0.1–1.0)
Monocytes Relative: 6 %
NEUTROS ABS: 10.1 10*3/uL — AB (ref 1.7–7.7)
NEUTROS PCT: 67 %
Platelets: 306 10*3/uL (ref 150–400)
RBC: 5.19 MIL/uL (ref 4.22–5.81)
RDW: 13.3 % (ref 11.5–15.5)
WBC: 15.2 10*3/uL — ABNORMAL HIGH (ref 4.0–10.5)

## 2016-03-23 LAB — CBG MONITORING, ED: Glucose-Capillary: 231 mg/dL — ABNORMAL HIGH (ref 65–99)

## 2016-03-23 NOTE — ED Provider Notes (Signed)
[facility] DEPT Provider Note   CSN: [id number] Arrival date & time: 03/23/16  2203  By signing my name below, I, Delton Prairie, attest that this documentation has been prepared under the direction and in the presence of Merryl Hacker, MD  Electronically Signed: Delton Prairie, ED Scribe. 03/23/16. 12:16 AM.  History   Chief Complaint Chief Complaint  Patient presents with  . Withdrawal   The history is provided by the patient. No language interpreter was used.   HPI Comments:  Douglas Melendez is a 60 y.o. male, with a hx of HTN, who presents to the Emergency Department complaining of difficulty sleeping x 1 week. Pt states he has run out of his Xanax which is why he is having trouble going to sleep. 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 Pt notes he last took Xanax 1 week ago and a dose of Suboxone 12 hours ago. He has not tried any outpatient programs and denies any SI, HI, alcohol use and drug use. Pt visited [facility] on 03/22/16 for similar symptoms.   Patient was seen at [facility] long hospital yesterday. He was evaluated by TTS. He was given outpatient resources. He does report intermittent hallucinations but denies any at this time. Denies any physical complaints. Reports that he has not seen his primary physician. Per triage notes, patient was aggressive in triage. His EKG was notably tachycardic but he was very agitated and angry.  Past Medical History:  Diagnosis Date  . Hypertension     Patient Active Problem List   Diagnosis Date Noted  . Anxiety 03/22/2016    History reviewed. No pertinent surgical history.   Home Medications    Prior to Admission medications   Medication Sig Start Date End Date Taking? Authorizing Provider  ALPRAZolam Duanne Moron) 1 MG tablet Take 1 tablet (1 mg total) by mouth 2 (two) times daily. 06/25/13   Ernestina Patches, MD  amLODipine (NORVASC) 10 MG tablet Take 10 mg by mouth daily.    Historical Provider, MD  hydrOXYzine (ATARAX/VISTARIL) 25 MG  tablet Take 1 tablet (25 mg total) by mouth every 6 (six) hours as needed for anxiety. 03/22/16   Patrecia Pour, NP  lisinopril (PRINIVIL,ZESTRIL) 40 MG tablet Take by mouth. 01/27/16 01/26/17  Historical Provider, MD  metFORMIN (GLUCOPHAGE) 1000 MG tablet Take by mouth. 01/27/16 01/26/17  Historical Provider, MD  potassium chloride 20 MEQ TBCR Take 20 mEq by mouth daily. 03/24/16   Merryl Hacker, MD  tamsulosin (FLOMAX) 0.4 MG CAPS capsule Take by mouth. 01/27/16   Historical Provider, MD  traZODone (DESYREL) 50 MG tablet Take 1 tablet (50 mg total) by mouth at bedtime. 03/24/16   Merryl Hacker, MD    Family History History reviewed. No pertinent family history.  Social History Social History  Substance Use Topics  . Smoking status: Current Every Day Smoker    Packs/day: 1.00    Types: Cigarettes  . Smokeless tobacco: Never Used  . Alcohol use No     Allergies   Patient has no known allergies.   Review of Systems Review of Systems  Respiratory: Negative for shortness of breath.   Cardiovascular: Negative for chest pain.  Psychiatric/Behavioral: Positive for sleep disturbance. Negative for suicidal ideas.  All other systems reviewed and are negative.    Physical Exam Updated Vital Signs BP 156/95   Pulse 86   Temp 98.3 F (36.8 C) (Oral)   Resp 13   SpO2 90%   Physical Exam  Constitutional: He is oriented to person, place, and time. He appears well-developed and well-nourished.  Cooperative.  HENT:  Head: Normocephalic and atraumatic.  Cardiovascular: Normal rate and normal heart sounds.   No murmur heard. Tachycardic into the 100s  Pulmonary/Chest: Effort normal and breath sounds normal. No respiratory distress. He has no wheezes.  Abdominal: Soft. There is no tenderness.  Musculoskeletal: He exhibits no edema.  Neurological: He is alert and oriented to person, place, and time.  Skin: Skin is warm and dry.  Psychiatric:  Cooperative, difficulty with eye  contact  Nursing note and vitals reviewed.    ED Treatments / Results  DIAGNOSTIC STUDIES:  Oxygen Saturation is 93% on RA, adequate by my interpretation.    COORDINATION OF CARE:  12:04 AM Discussed treatment plan with pt at bedside and pt agreed to plan.  Labs (all labs ordered are listed, but only abnormal results are displayed) Labs Reviewed  CBC WITH DIFFERENTIAL/PLATELET - Abnormal; Notable for the following:       Result Value   WBC 15.2 (*)    Neutro Abs 10.1 (*)    All other components within normal limits  COMPREHENSIVE METABOLIC PANEL - Abnormal; Notable for the following:    Potassium 2.9 (*)    Glucose, Bld 196 (*)    Total Protein 8.7 (*)    AST 46 (*)    All other components within normal limits  URINALYSIS, ROUTINE W REFLEX MICROSCOPIC (NOT AT [facility])    EKG  EKG Interpretation  Date/Time:  Monday March 23 2016 22:12:40 EST Ventricular Rate:  155 PR Interval:  120 QRS Duration: 94 QT Interval:  312 QTC Calculation: 501 R Axis:   -20 Text Interpretation:  Sinus tachycardia Cannot rule out Inferior infarct , age undetermined Abnormal ECG Confirmed by Dina Rich  MD, [place] ([id number]) on 03/24/2016 1:13:29 AM       Radiology No results found.  Procedures Procedures (including critical care time)  Medications Ordered in ED Medications  potassium chloride SA (K-DUR,KLOR-CON) CR tablet 40 mEq (40 mEq Oral Given 03/24/16 0106)     Initial Impression / Assessment and Plan / ED Course  I have reviewed the triage vital signs and the nursing notes.  Pertinent labs & imaging results that were available during my care of the patient were reviewed by me and considered in my medical decision making (see chart for details).  Clinical Course     Patient presents continuing to complain of insomnia. Was seen and evaluated at [facility] long for the same as well as requesting Xanax for sleep. He was evaluated by behavioral health at that time as he is having  intermittent hallucinations. Outpatient resources provided. Patient has not followed up. Reports the Vistaril is not helping. He is nontoxic. Initially tachycardic in triage but was reportedly very agitated. Currently heart rate 90s to 100s. He is otherwise asymptomatic. No tremors. He is then also an extra one week. Feel he is out of the window of acute withdrawal. Will provide trazodone for sleep. Additionally, patient noted to be hypokalemic. This was replaced. Follow-up with primary physician for recheck. Additional community resources provided.  After history, exam, and medical workup I feel the patient has been appropriately medically screened and is safe for discharge home. Pertinent diagnoses were discussed with the patient. Patient was given return precautions.   Final Clinical Impressions(s) / ED Diagnoses   Final diagnoses:  Drug induced insomnia (HCC)  Hypokalemia    New Prescriptions New Prescriptions   POTASSIUM  CHLORIDE 20 MEQ TBCR    Take 20 mEq by mouth daily.   TRAZODONE (DESYREL) 50 MG TABLET    Take 1 tablet (50 mg total) by mouth at bedtime.   I personally performed the services described in this documentation, which was scribed in my presence. The recorded information has been reviewed and is accurate.     Merryl Hacker, MD 03/24/16 [phone]

## 2016-03-23 NOTE — ED Triage Notes (Signed)
Pt st's he has been on xanax since the 1980's  Has rx for 3 times a day.  Pt st's has not had any in 7 days.  Pt very anxious, hands shaking, very diaphoretic
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
## 2016-03-24 LAB — COMPREHENSIVE METABOLIC PANEL
ALBUMIN: 4.5 g/dL (ref 3.5–5.0)
ALK PHOS: 87 U/L (ref 38–126)
ALT: 29 U/L (ref 17–63)
AST: 46 U/L — ABNORMAL HIGH (ref 15–41)
Anion gap: 14 (ref 5–15)
BILIRUBIN TOTAL: 0.8 mg/dL (ref 0.3–1.2)
BUN: 14 mg/dL (ref 6–20)
CALCIUM: 9.7 mg/dL (ref 8.9–10.3)
CO2: 22 mmol/L (ref 22–32)
CREATININE: 1.01 mg/dL (ref 0.61–1.24)
Chloride: 102 mmol/L (ref 101–111)
GFR calc Af Amer: >60 mL/min (ref >60)
GFR calc non Af Amer: >60 mL/min (ref >60)
GLUCOSE: 196 mg/dL — AB (ref 65–99)
Potassium: 2.9 mmol/L — ABNORMAL LOW (ref 3.5–5.1)
SODIUM: 138 mmol/L (ref 135–145)
TOTAL PROTEIN: 8.7 g/dL — AB (ref 6.5–8.1)

## 2016-03-24 MED ORDER — POTASSIUM CHLORIDE CRYS ER 20 MEQ PO TBCR
40.0000 meq | EXTENDED_RELEASE_TABLET | Freq: Two times a day (BID) | ORAL | Status: DC
  Administered 2016-03-24: 40 meq via ORAL

## 2016-03-24 MED ORDER — TRAZODONE HCL 50 MG PO TABS: 50.0000 mg | ORAL_TABLET | Freq: Every day | ORAL | 0 refills | Status: DC

## 2016-03-24 MED ORDER — POTASSIUM CHLORIDE ER 20 MEQ PO TBCR: 20.0000 meq | EXTENDED_RELEASE_TABLET | Freq: Every day | ORAL | 0 refills | Status: DC

## 2016-03-24 NOTE — Discharge Instructions (Signed)
You were seen today for insomnia related to discontinuing Xanax. You're out of the window for bad withdrawal symptoms. See list provided for outpatient resources and follow-up with your primary physician.

## 2016-03-25 ENCOUNTER — Encounter (HOSPITAL_COMMUNITY): Payer: Self-pay

## 2016-03-25 ENCOUNTER — Emergency Department (HOSPITAL_COMMUNITY)
Admission: EM | Admit: 2016-03-25 | Discharge: 2016-03-25 | Disposition: A | Discharge status: Home / Self Care | Payer: Medicaid Other | Attending: Emergency Medicine | Admitting: Emergency Medicine

## 2016-03-25 ENCOUNTER — Encounter (HOSPITAL_COMMUNITY): Payer: Self-pay | Admitting: *Deleted

## 2016-03-25 ENCOUNTER — Emergency Department (EMERGENCY_DEPARTMENT_HOSPITAL)
Admission: EM | Admit: 2016-03-25 | Discharge: 2016-03-26 | Disposition: A | Discharge status: Home / Self Care | Payer: Medicaid Other | Source: Home / Self Care | Attending: Emergency Medicine | Admitting: Emergency Medicine

## 2016-03-25 DIAGNOSIS — R5383 Other fatigue: Secondary | ICD-10-CM | POA: Diagnosis not present

## 2016-03-25 DIAGNOSIS — R443 Hallucinations, unspecified: Secondary | ICD-10-CM

## 2016-03-25 DIAGNOSIS — F1320 Sedative, hypnotic or anxiolytic dependence, uncomplicated: Secondary | ICD-10-CM

## 2016-03-25 DIAGNOSIS — G4700 Insomnia, unspecified: Secondary | ICD-10-CM | POA: Diagnosis not present

## 2016-03-25 DIAGNOSIS — F13231 Sedative, hypnotic or anxiolytic dependence with withdrawal delirium: Secondary | ICD-10-CM

## 2016-03-25 DIAGNOSIS — I10 Essential (primary) hypertension: Secondary | ICD-10-CM | POA: Insufficient documentation

## 2016-03-25 DIAGNOSIS — Z7984 Long term (current) use of oral hypoglycemic drugs: Secondary | ICD-10-CM | POA: Diagnosis not present

## 2016-03-25 DIAGNOSIS — F13931 Sedative, hypnotic or anxiolytic use, unspecified with withdrawal delirium: Secondary | ICD-10-CM

## 2016-03-25 DIAGNOSIS — F17210 Nicotine dependence, cigarettes, uncomplicated: Secondary | ICD-10-CM | POA: Insufficient documentation

## 2016-03-25 DIAGNOSIS — Z79899 Other long term (current) drug therapy: Secondary | ICD-10-CM

## 2016-03-25 DIAGNOSIS — F19239 Other psychoactive substance dependence with withdrawal, unspecified: Secondary | ICD-10-CM | POA: Insufficient documentation

## 2016-03-25 DIAGNOSIS — F419 Anxiety disorder, unspecified: Secondary | ICD-10-CM | POA: Diagnosis not present

## 2016-03-25 DIAGNOSIS — F13239 Sedative, hypnotic or anxiolytic dependence with withdrawal, unspecified: Secondary | ICD-10-CM | POA: Diagnosis present

## 2016-03-25 LAB — I-STAT CHEM 8, ED
BUN: 16 mg/dL (ref 6–20)
Calcium, Ion: 1.08 mmol/L — ABNORMAL LOW (ref 1.15–1.40)
Chloride: 102 mmol/L (ref 101–111)
Creatinine, Ser: 0.9 mg/dL (ref 0.61–1.24)
GLUCOSE: 149 mg/dL — AB (ref 65–99)
HCT: 46 % (ref 39.0–52.0)
HEMOGLOBIN: 15.6 g/dL (ref 13.0–17.0)
POTASSIUM: 3.7 mmol/L (ref 3.5–5.1)
Sodium: 139 mmol/L (ref 135–145)
TCO2: 23 mmol/L (ref 0–100)

## 2016-03-25 LAB — CBG MONITORING, ED: GLUCOSE-CAPILLARY: 159 mg/dL — AB (ref 65–99)

## 2016-03-25 MED ORDER — TAMSULOSIN HCL 0.4 MG PO CAPS: 0.4000 mg | ORAL_CAPSULE | Freq: Every day | ORAL | Status: DC

## 2016-03-25 MED ORDER — POTASSIUM CHLORIDE CRYS ER 20 MEQ PO TBCR
20.0000 meq | EXTENDED_RELEASE_TABLET | Freq: Every day | ORAL | Status: DC
  Administered 2016-03-26: 20 meq via ORAL
  Filled 2016-03-25: qty 1

## 2016-03-25 MED ORDER — GABAPENTIN 300 MG PO CAPS
300.0000 mg | ORAL_CAPSULE | Freq: Three times a day (TID) | ORAL | Status: DC
  Administered 2016-03-25 – 2016-03-26 (×2): 300 mg via ORAL
  Filled 2016-03-25 (×2): qty 1

## 2016-03-25 MED ORDER — TRAZODONE HCL 50 MG PO TABS
50.0000 mg | ORAL_TABLET | Freq: Every day | ORAL | Status: DC
  Administered 2016-03-25: 50 mg via ORAL
  Filled 2016-03-25: qty 1

## 2016-03-25 MED ORDER — HYDROXYZINE HCL 10 MG PO TABS
10.0000 mg | ORAL_TABLET | Freq: Once | ORAL | Status: DC
  Filled 2016-03-25: qty 1

## 2016-03-25 MED ORDER — METFORMIN HCL 500 MG PO TABS
1000.0000 mg | ORAL_TABLET | Freq: Two times a day (BID) | ORAL | Status: DC
  Administered 2016-03-26: 1000 mg via ORAL
  Filled 2016-03-25: qty 2

## 2016-03-25 MED ORDER — HYDROXYZINE HCL 25 MG PO TABS
25.0000 mg | ORAL_TABLET | Freq: Four times a day (QID) | ORAL | Status: DC | PRN
  Administered 2016-03-25 – 2016-03-26 (×2): 25 mg via ORAL
  Filled 2016-03-25 (×3): qty 1

## 2016-03-25 MED ORDER — AMLODIPINE BESYLATE 5 MG PO TABS
10.0000 mg | ORAL_TABLET | Freq: Every day | ORAL | Status: DC
  Administered 2016-03-25 – 2016-03-26 (×2): 10 mg via ORAL
  Filled 2016-03-25 (×2): qty 2

## 2016-03-25 MED ORDER — LISINOPRIL 40 MG PO TABS
40.0000 mg | ORAL_TABLET | Freq: Every day | ORAL | Status: DC
  Administered 2016-03-26: 40 mg via ORAL
  Filled 2016-03-25: qty 1

## 2016-03-25 NOTE — ED Notes (Signed)
Pt not in room at this time. Will check back shortly.

## 2016-03-25 NOTE — Progress Notes (Signed)
Patient listed as having [employer] for insurance and no pcp.  Per chart review patient has been seen in the ED 6 times within the last six months.  Patient also has Medicaid [employer].  Pcp listed on patient's insurance card is located [place] [id number] [place], [place].  This information was placed on AVS.  No further EDCM needs at this time.

## 2016-03-25 NOTE — ED Notes (Signed)
Pt requesting to have xanax because he states that is the only medication that works for him. Pt refusing to take ordered night meds at this time. Pt states "I'm going to die, if I don't get my xanax". Dr. Maryan Rued notified.

## 2016-03-25 NOTE — ED Triage Notes (Signed)
Patient presents to ED for the 4 time in 2 days.  His roommate called [employer] stating patient was hallucinating and asking for help.  [employer] unaware if patient is SI or HI.

## 2016-03-25 NOTE — ED Triage Notes (Signed)
Patient changed into purple scrubs/yellow socks. Patient and belongings wanded by security. Patient's two belonging bags placed in [place] [address].

## 2016-03-25 NOTE — ED Notes (Signed)
Bed: [id number] Expected date:  Expected time:  Means of arrival:  Comments: 60 yo M/ withdrawal xanax
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
## 2016-03-25 NOTE — ED Triage Notes (Addendum)
Patient denies SI/HI.  He states he is here for help.  When asked what that consists of, he states, "Drugs."  Patient denies use of street drugs and EtOH.  He takes prescription medications Xanax, but has not had any since 10/28 or 10/29 because "I've been abusing them."   he states, "I think that is what is causing all this."  Patient has Suboxone, but states, "I hate taking them." Patient endorses AVH, but does not appear to be responding to internal stimuli.  Patient states he is seeing the "afterlife," and trying to figure everything out.   Patient called his PCP and was directed to ER.

## 2016-03-25 NOTE — ED Notes (Signed)
Marcus, TTS in with pt.

## 2016-03-25 NOTE — ED Notes (Signed)
Pt left before receiving requested medication.

## 2016-03-25 NOTE — ED Notes (Signed)
Pt states that he's withdrawing from xanax but he has tramadol and states he's been taking a lot of those and didn't realize that was pain medication,
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
## 2016-03-25 NOTE — ED Notes (Addendum)
Pt asking for a dose of xanax before he goes. MD made aware.

## 2016-03-26 DIAGNOSIS — F17210 Nicotine dependence, cigarettes, uncomplicated: Secondary | ICD-10-CM

## 2016-03-26 DIAGNOSIS — F419 Anxiety disorder, unspecified: Secondary | ICD-10-CM | POA: Diagnosis not present

## 2016-03-26 DIAGNOSIS — Z79899 Other long term (current) drug therapy: Secondary | ICD-10-CM | POA: Diagnosis not present

## 2016-03-26 DIAGNOSIS — F1320 Sedative, hypnotic or anxiolytic dependence, uncomplicated: Secondary | ICD-10-CM

## 2016-03-26 LAB — CBG MONITORING, ED: GLUCOSE-CAPILLARY: 141 mg/dL — AB (ref 65–99)

## 2016-03-26 MED ORDER — LORAZEPAM 1 MG PO TABS
1.0000 mg | ORAL_TABLET | Freq: Once | ORAL | Status: DC
Start: 2016-03-26 — End: 2016-03-26

## 2016-03-26 MED ORDER — LORAZEPAM 1 MG PO TABS: 2.0000 mg | ORAL_TABLET | Freq: Once | ORAL | Status: DC

## 2016-03-26 MED ORDER — LORAZEPAM 2 MG/ML IJ SOLN
2.0000 mg | Freq: Once | INTRAMUSCULAR | Status: AC
Start: 2016-03-26 — End: 2016-03-26
  Administered 2016-03-26: 2 mg via INTRAMUSCULAR
  Filled 2016-03-26: qty 1

## 2016-03-26 NOTE — BHH Suicide Risk Assessment (Signed)
Suicide Risk Assessment  Discharge Assessment   [facility] Discharge Suicide Risk Assessment   Principal Problem: Sedative, hypnotic or anxiolytic dependence, uncomplicated [facility]) Discharge Diagnoses:  Patient Active Problem List   Diagnosis Date Noted  . Sedative, hypnotic or anxiolytic dependence, uncomplicated ([place]) [id number] 03/26/2016  . Anxiety [F41.9] 03/22/2016    Total Time spent with patient: 15 minutes  Musculoskeletal: Strength & Muscle Tone: within normal limits Gait & Station: normal Patient leans: N/A  Psychiatric Specialty Exam:   Blood pressure 159/100, pulse 120, temperature 98.7 F (37.1 C), temperature source Oral, resp. rate 22, height 6' (1.829 m), weight 104.3 kg (230 lb), SpO2 95 %.Body mass index is 31.19 kg/m.   General Appearance: Fairly Groomed  Eye Contact:  Good  Speech:  Clear and Coherent and Normal Rate  Volume:  Normal  Mood:  Anxious  Affect:  Congruent  Thought Process:  Coherent and Goal Directed  Orientation:  Full (Time, Place, and Person)  Thought Content:  Logical  Suicidal Thoughts:  No  Homicidal Thoughts:  No  Memory:  Immediate;   Good Recent;   Fair  Judgement:  Intact  Insight:  Fair  Psychomotor Activity:  Increased  Concentration:  Concentration: Fair and Attention Span: Fair  Recall:  Good  Fund of Knowledge:  Good  Language:  Good  Akathisia:  No  Handed:  Right  AIMS (if indicated):     Assets:  Communication Skills Desire for Improvement Physical Health Resilience  ADL's:  Intact  Cognition:  WNL  Sleep:      Mental Status Per Nursing Assessment::   On Admission:     Demographic Factors:  Male, Caucasian and Low socioeconomic status  Loss Factors: Loss of significant relationship  Historical Factors: NA  Risk Reduction Factors:   Positive social support  Continued Clinical Symptoms:  Anxiety  Cognitive Features That Contribute To Risk:  None    Suicide Risk:  Minimal: No identifiable suicidal  ideation.  Patients presenting with no risk factors but with morbid ruminations; may be classified as minimal risk based on the severity of the depressive symptoms  Follow-up [place]. Schedule an appointment as soon as possible for a visit.   Specialty:  Family Medicine Why:  This is the doctor who is listed on your Medicaid card.  If you wish to change the doctor on your insurance card, you may do so by calling the DSS.  Please make follow up appointment with your pcp as soon as possible.          Plan Of Care/Follow-up recommendations:  Activity:  as tolerated Diet:  Heart healthy  Serena Colonel, FNP-BC [place] 03/26/2016, 11:45 AM

## 2016-03-26 NOTE — ED Notes (Signed)
When patient heard he was discharged he called his doctor and has a plan to see him next week and pick up enough medication to make it until his appointment.  Patient left before being formally discharged although that was the plan for him soon.

## 2016-03-26 NOTE — BH Assessment (Signed)
[facility] Assessment Progress Note  Per Corena Pilgrim, MD, this pt does not require psychiatric hospitalization at this time.  Pt is to be discharged from [facility] with recommendation to follow up with this PCP, [place].  This has been included in pt's discharge instructions.  Pt's nurse has been notified.  Jalene Mullet, [place] Triage Specialist [phone]

## 2016-03-26 NOTE — ED Notes (Signed)
No respiratory or acute distress noted alert and oriented call light in reach.

## 2016-03-26 NOTE — ED Notes (Signed)
Patient having trouble eating breakfast he is shaking so bad.  Night nurse reported that patient is a long term user of Xanax and was recently discharged from [facility] without any tapering of his long term benzo use.  Patient not sleeping, agitated and begging for help.  ED physician notified of patient's condition.   Ativan 2mg  ordered IM.

## 2016-03-26 NOTE — Discharge Instructions (Signed)
For your ongoing behavioral health needs, you are advised to follow up with your current primary care provider at [facility]:       [place]      [id number] [place], [place] [address]      [phone]
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
## 2016-03-26 NOTE — ED Notes (Signed)
No respiratory or acute distress noted pt talking fast states he feels like he is being treated like a dog and that no one will tell him what is going on. Alert and oriented x 3 call light in reach.

## 2016-05-26 DIAGNOSIS — J449 Chronic obstructive pulmonary disease, unspecified: Secondary | ICD-10-CM | POA: Insufficient documentation

## 2016-05-26 DIAGNOSIS — M7122 Synovial cyst of popliteal space [Baker], left knee: Secondary | ICD-10-CM | POA: Insufficient documentation

## 2016-05-26 DIAGNOSIS — F322 Major depressive disorder, single episode, severe without psychotic features: Secondary | ICD-10-CM | POA: Insufficient documentation

## 2016-08-15 ENCOUNTER — Emergency Department (HOSPITAL_COMMUNITY): Payer: Medicaid Other

## 2016-08-15 ENCOUNTER — Emergency Department (HOSPITAL_COMMUNITY)
Admission: EM | Admit: 2016-08-15 | Discharge: 2016-08-15 | Disposition: A | Discharge status: Home / Self Care | Payer: Medicaid Other | Attending: Emergency Medicine | Admitting: Emergency Medicine

## 2016-08-15 ENCOUNTER — Encounter (HOSPITAL_COMMUNITY): Payer: Self-pay

## 2016-08-15 DIAGNOSIS — Y939 Activity, unspecified: Secondary | ICD-10-CM | POA: Insufficient documentation

## 2016-08-15 DIAGNOSIS — F17210 Nicotine dependence, cigarettes, uncomplicated: Secondary | ICD-10-CM | POA: Insufficient documentation

## 2016-08-15 DIAGNOSIS — Z7984 Long term (current) use of oral hypoglycemic drugs: Secondary | ICD-10-CM | POA: Insufficient documentation

## 2016-08-15 DIAGNOSIS — Z79899 Other long term (current) drug therapy: Secondary | ICD-10-CM | POA: Diagnosis not present

## 2016-08-15 DIAGNOSIS — M25562 Pain in left knee: Secondary | ICD-10-CM | POA: Diagnosis present

## 2016-08-15 DIAGNOSIS — W231XXA Caught, crushed, jammed, or pinched between stationary objects, initial encounter: Secondary | ICD-10-CM | POA: Insufficient documentation

## 2016-08-15 DIAGNOSIS — I10 Essential (primary) hypertension: Secondary | ICD-10-CM | POA: Insufficient documentation

## 2016-08-15 DIAGNOSIS — Y929 Unspecified place or not applicable: Secondary | ICD-10-CM | POA: Insufficient documentation

## 2016-08-15 DIAGNOSIS — Y999 Unspecified external cause status: Secondary | ICD-10-CM | POA: Diagnosis not present

## 2016-08-15 MED ORDER — ACETAMINOPHEN 500 MG PO TABS
1000.0000 mg | ORAL_TABLET | Freq: Once | ORAL | Status: AC
  Administered 2016-08-15: 1000 mg via ORAL
  Filled 2016-08-15: qty 2

## 2016-08-15 NOTE — ED Triage Notes (Signed)
PT RECEIVED FROM HOME VIA EMS C/O LEFT KNEE PAIN AND SWELLING SINCE Thursday. PT STS HE WAS MOVING FURNITURE, WHEN HIS WAS CAUGHT BETWEEN THE WALL AND THE CABINET. PT STS HE IS UNABLE TO BEAR WEIGHT ON THAT LEG.

## 2016-08-15 NOTE — ED Notes (Signed)
Bed: WTR8 Expected date:  Expected time:  Means of arrival:  Comments: EMS 61 yo male/moving Thursday on furniture/hurt knee

## 2016-08-15 NOTE — ED Provider Notes (Signed)
[name] DEPT Provider Note   CSN: [id number] Arrival date & time: 08/15/16  2026   By signing my name below, I, Delton Prairie, attest that this documentation has been prepared under the direction and in the presence of  [employer], PA-C. Electronically Signed: Delton Prairie, ED Scribe. 08/15/16. 9:02 PM.   History   Chief Complaint Chief Complaint  Patient presents with  . Knee Pain    LEFT    HPI Comments:  Douglas Melendez is a 61 y.o. male, with a PMHx of HTN, who presents to the Emergency Department complaining of acute onset, persistent, moderate left knee pain s/p a incident which occurred 2 days ago. He specifies that his pain is located to the left side of his left knee. Pt states he was moving a 140 pound cabinet which fell causing him to wedge his leg between a wall and the piece of furniture he was moving. His pain is worse when bearing weight on his left lower extremity. He notes a hx of chronic pain in his left knee. Pt has taken aspirin with no relief. Pt denies numbness or any other associated symptoms.  Patient's chronic pain is treated with Suboxone.  The history is provided by medical records and the patient. No language interpreter was used.    Past Medical History:  Diagnosis Date  . Hypertension     Patient Active Problem List   Diagnosis Date Noted  . Sedative, hypnotic or anxiolytic dependence, uncomplicated ([place]) 20/25/4270  . Anxiety 03/22/2016    History reviewed. No pertinent surgical history.     Home Medications    Prior to Admission medications   Medication Sig Start Date End Date Taking? Authorizing Provider  albuterol (PROVENTIL HFA;VENTOLIN HFA) 108 (90 Base) MCG/ACT inhaler Inhale 2 puffs into the lungs every 6 (six) hours as needed for wheezing or shortness of breath.    Historical Provider, MD  ALPRAZolam Duanne Moron) 1 MG tablet Take 1 tablet (1 mg total) by mouth 2 (two) times daily. Patient taking differently: Take 1 mg by  mouth 3 (three) times daily as needed for anxiety or sleep.  06/25/13   Ernestina Patches, MD  amLODipine (NORVASC) 10 MG tablet Take 10 mg by mouth daily.    Historical Provider, MD  buprenorphine-naloxone (SUBOXONE) 8-2 MG SUBL SL tablet Place 1 tablet under the tongue 3 (three) times daily.    Historical Provider, MD  gabapentin (NEURONTIN) 300 MG capsule Take 300 mg by mouth 3 (three) times daily.    Historical Provider, MD  hydrOXYzine (ATARAX/VISTARIL) 25 MG tablet Take 1 tablet (25 mg total) by mouth every 6 (six) hours as needed for anxiety. 03/22/16   Patrecia Pour, NP  lisinopril (PRINIVIL,ZESTRIL) 40 MG tablet Take by mouth. 01/27/16 01/26/17  Historical Provider, MD  metFORMIN (GLUCOPHAGE) 1000 MG tablet Take 1,000 mg by mouth 2 (two) times daily with a meal.  01/27/16 01/26/17  Historical Provider, MD  potassium chloride 20 MEQ TBCR Take 20 mEq by mouth daily. 03/24/16   Merryl Hacker, MD  tamsulosin (FLOMAX) 0.4 MG CAPS capsule Take by mouth. 01/27/16   Historical Provider, MD  traZODone (DESYREL) 50 MG tablet Take 1 tablet (50 mg total) by mouth at bedtime. 03/24/16   Merryl Hacker, MD    Family History History reviewed. No pertinent family history.  Social History Social History  Substance Use Topics  . Smoking status: Current Every Day Smoker    Packs/day: 2.00    Types: Cigarettes  .  Smokeless tobacco: Never Used  . Alcohol use No     Allergies   Patient has no known allergies.   Review of Systems Review of Systems  Constitutional: Negative for chills and fever.  Musculoskeletal: Positive for arthralgias and myalgias. Negative for back pain and neck pain.  Neurological: Negative for numbness.  All other systems reviewed and are negative.   Physical Exam Updated Vital Signs BP (!) 139/91 (BP Location: Left Arm)   Pulse (!) 102   Temp 98.3 F (36.8 C) (Oral)   Resp 20   Ht 5\' 11"  (1.803 m)   Wt 105.7 kg   SpO2 98%   BMI 32.50 kg/m   Physical Exam    Constitutional: He appears well-developed and well-nourished. No distress.  HENT:  Head: Normocephalic and atraumatic.  Eyes: Conjunctivae are normal.  Neck: Normal range of motion.  Cardiovascular: Normal rate, regular rhythm and intact distal pulses.   Capillary refill < 3 sec  Pulmonary/Chest: Effort normal and breath sounds normal.  Musculoskeletal: He exhibits tenderness and deformity. He exhibits no edema.  Left knee with full extension and flexion to 110 degrees. Chronic arthritic deformity of the knee. No joint effusion, no abnormal patellar movement. Pt able to bear weight with significant pain to the lateral aspect of the knee. No TTP of the medial joint line.   Neurological: He is alert. Coordination normal.  Sensation intact Strength 5/5 with dorsiflexion and plantarflexion. Strength 4/5 with flexion and extension of the knee.   Skin: Skin is warm and dry. He is not diaphoretic.  No tenting of the skin  Psychiatric: He has a normal mood and affect.  Nursing note and vitals reviewed.    ED Treatments / Results  DIAGNOSTIC STUDIES:  Oxygen Saturation is 98% on RA, normal by my interpretation.    COORDINATION OF CARE:  8:42 PM Discussed treatment plan with pt at bedside and pt agreed to plan.  Radiology Dg Knee Complete 4 Views Left  Result Date: 08/15/2016 CLINICAL DATA:  Left lateral knee pain in swelling for 3 days. EXAM: LEFT KNEE - COMPLETE 4+ VIEW COMPARISON:  None. FINDINGS: Sharpening of the tibial spines noted. There is moderate medial compartment narrowing and marginal spur formation. 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 No fracture or subluxation. No joint effusion. IMPRESSION: 1. Osteoarthritis. 2. No acute findings. Electronically Signed   By: Kerby Moors M.D.   On: 08/15/2016 21:26    Procedures .Splint Application Date/Time: 5/40/9811 10:08 PM Performed by: Abigail Butts Authorized by: Abigail Butts   Consent:     Consent obtained:  Verbal   Consent given by:  Patient   Risks discussed:  Discoloration, numbness, pain and swelling   Alternatives discussed:  No treatment Pre-procedure details:    Sensation:  Normal   Skin color:  Pink Procedure details:    Laterality:  Left   Location:  Knee   Knee:  L knee   Strapping: no     Cast type: knee sleeve.   Supplies:  Elastic bandage Post-procedure details:    Pain:  Unchanged   Sensation:  Normal   Skin color:  Pink   Patient tolerance of procedure:  Tolerated well, no immediate complications   (including critical care time)  Medications Ordered in ED Medications  acetaminophen (TYLENOL) tablet 1,000 mg (1,000 mg Oral Given 08/15/16 2055)     Initial Impression / Assessment and Plan / ED Course  I have reviewed the triage vital signs and the nursing notes.  Pertinent labs & imaging results that were available during my care of the patient were reviewed by me and considered in my medical decision making (see chart for details).     Patient X-Ray negative for obvious fracture or dislocation. Pain managed in ED. Pt advised to follow up with orthopedics if symptoms persist. Patient given brace while in ED, conservative therapy recommended and discussed. Patient will be dc home & is agreeable with above plan.  Triage vitals noted tachycardia however patient is without tachycardia, chest pain or shortness of breath on my exam.   Final Clinical Impressions(s) / ED Diagnoses   Final diagnoses:  Acute pain of left knee    New Prescriptions Discharge Medication List as of 08/15/2016 10:08 PM      I personally performed the services described in this documentation, which was scribed in my presence. The recorded information has been reviewed and is accurate.     Jarrett Soho Terease Marcotte, PA-C 08/16/16 [id number]    Virgel Manifold, MD 08/17/16 [phone]

## 2016-08-15 NOTE — ED Notes (Signed)
PT DISCHARGED. INSTRUCTIONS GIVEN. AAOX4. PT IN NO APPARENT DISTRESS. THE OPPORTUNITY TO ASK QUESTIONS WAS PROVIDED. 

## 2016-08-15 NOTE — Discharge Instructions (Signed)

## 2016-08-20 DIAGNOSIS — K116 Mucocele of salivary gland: Secondary | ICD-10-CM | POA: Insufficient documentation

## 2016-10-21 DIAGNOSIS — Z6831 Body mass index (BMI) 31.0-31.9, adult: Secondary | ICD-10-CM | POA: Insufficient documentation

## 2017-06-16 ENCOUNTER — Emergency Department (HOSPITAL_COMMUNITY): Payer: No Typology Code available for payment source

## 2017-06-16 ENCOUNTER — Other Ambulatory Visit: Payer: Self-pay

## 2017-06-16 ENCOUNTER — Emergency Department (HOSPITAL_COMMUNITY)
Admission: EM | Admit: 2017-06-16 | Discharge: 2017-06-16 | Disposition: A | Discharge status: Home / Self Care | Payer: No Typology Code available for payment source | Attending: Emergency Medicine | Admitting: Emergency Medicine

## 2017-06-16 ENCOUNTER — Encounter (HOSPITAL_COMMUNITY): Payer: Self-pay | Admitting: *Deleted

## 2017-06-16 DIAGNOSIS — I10 Essential (primary) hypertension: Secondary | ICD-10-CM | POA: Insufficient documentation

## 2017-06-16 DIAGNOSIS — M7918 Myalgia, other site: Secondary | ICD-10-CM | POA: Diagnosis not present

## 2017-06-16 DIAGNOSIS — Z79899 Other long term (current) drug therapy: Secondary | ICD-10-CM | POA: Diagnosis not present

## 2017-06-16 DIAGNOSIS — F17210 Nicotine dependence, cigarettes, uncomplicated: Secondary | ICD-10-CM | POA: Insufficient documentation

## 2017-06-16 DIAGNOSIS — E1165 Type 2 diabetes mellitus with hyperglycemia: Secondary | ICD-10-CM | POA: Insufficient documentation

## 2017-06-16 DIAGNOSIS — Z794 Long term (current) use of insulin: Secondary | ICD-10-CM | POA: Insufficient documentation

## 2017-06-16 DIAGNOSIS — R42 Dizziness and giddiness: Secondary | ICD-10-CM | POA: Diagnosis not present

## 2017-06-16 DIAGNOSIS — M79605 Pain in left leg: Secondary | ICD-10-CM | POA: Diagnosis present

## 2017-06-16 DIAGNOSIS — R739 Hyperglycemia, unspecified: Secondary | ICD-10-CM

## 2017-06-16 LAB — CBC WITH DIFFERENTIAL/PLATELET
Basophils Absolute: 0 10*3/uL (ref 0.0–0.1)
Basophils Relative: 0 %
Eosinophils Absolute: 0.2 10*3/uL (ref 0.0–0.7)
Eosinophils Relative: 2 %
HCT: 44.3 % (ref 39.0–52.0)
Hemoglobin: 15.5 g/dL (ref 13.0–17.0)
Lymphocytes Relative: 39 %
Lymphs Abs: 3.6 10*3/uL (ref 0.7–4.0)
MCH: 31 pg (ref 26.0–34.0)
MCHC: 35 g/dL (ref 30.0–36.0)
MCV: 88.6 fL (ref 78.0–100.0)
Monocytes Absolute: 0.5 10*3/uL (ref 0.1–1.0)
Monocytes Relative: 5 %
Neutro Abs: 4.8 10*3/uL (ref 1.7–7.7)
Neutrophils Relative %: 54 %
Platelets: 223 10*3/uL (ref 150–400)
RBC: 5 MIL/uL (ref 4.22–5.81)
RDW: 13.7 % (ref 11.5–15.5)
WBC: 9.1 10*3/uL (ref 4.0–10.5)

## 2017-06-16 LAB — BASIC METABOLIC PANEL WITH GFR
Anion gap: 11 (ref 5–15)
BUN: 14 mg/dL (ref 6–20)
CO2: 20 mmol/L — ABNORMAL LOW (ref 22–32)
Calcium: 9 mg/dL (ref 8.9–10.3)
Chloride: 101 mmol/L (ref 101–111)
Creatinine, Ser: 0.9 mg/dL (ref 0.61–1.24)
GFR calc Af Amer: >60 mL/min
GFR calc non Af Amer: >60 mL/min
Glucose, Bld: 350 mg/dL — ABNORMAL HIGH (ref 65–99)
Potassium: 4.3 mmol/L (ref 3.5–5.1)
Sodium: 132 mmol/L — ABNORMAL LOW (ref 135–145)

## 2017-06-16 LAB — CBG MONITORING, ED
Glucose-Capillary: 365 mg/dL — ABNORMAL HIGH (ref 65–99)
Glucose-Capillary: 249 mg/dL — ABNORMAL HIGH (ref 65–99)

## 2017-06-16 MED ORDER — INSULIN ASPART 100 UNIT/ML ~~LOC~~ SOLN
8.0000 [IU] | Freq: Once | SUBCUTANEOUS | Status: AC
  Administered 2017-06-16: 8 [IU] via SUBCUTANEOUS
  Filled 2017-06-16: qty 1

## 2017-06-16 MED ORDER — SODIUM CHLORIDE 0.9 % IV BOLUS (SEPSIS)
1000.0000 mL | Freq: Once | INTRAVENOUS | Status: AC
  Administered 2017-06-16: 1000 mL via INTRAVENOUS

## 2017-06-16 MED ORDER — GLUCOSE BLOOD VI STRP: ORAL_STRIP | 12 refills | Status: AC

## 2017-06-16 NOTE — Discharge Instructions (Signed)
Please read attached information. If you experience any new or worsening signs or symptoms please return to the emergency room for evaluation. Please follow-up with your primary care provider or specialist as discussed.  Please continue using metformin at home, follow-up with your primary care provider as you will likely need medical management for your ongoing uncontrolled diabetes.  Return immediately with any new or worsening signs or symptoms.

## 2017-06-16 NOTE — ED Notes (Signed)
PT states understanding of care given, follow up care, and medication prescribed. PT ambulated from ED to car with a steady gait. 
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
## 2017-09-25 ENCOUNTER — Other Ambulatory Visit: Payer: Self-pay

## 2017-09-25 ENCOUNTER — Encounter (HOSPITAL_COMMUNITY): Payer: Self-pay | Admitting: Emergency Medicine

## 2017-09-25 ENCOUNTER — Emergency Department (HOSPITAL_COMMUNITY)
Admission: EM | Admit: 2017-09-25 | Discharge: 2017-09-25 | Disposition: A | Discharge status: Home / Self Care | Payer: Medicaid Other | Attending: Emergency Medicine | Admitting: Emergency Medicine

## 2017-09-25 DIAGNOSIS — I10 Essential (primary) hypertension: Secondary | ICD-10-CM | POA: Diagnosis not present

## 2017-09-25 DIAGNOSIS — M25562 Pain in left knee: Secondary | ICD-10-CM | POA: Insufficient documentation

## 2017-09-25 DIAGNOSIS — F17210 Nicotine dependence, cigarettes, uncomplicated: Secondary | ICD-10-CM | POA: Diagnosis not present

## 2017-09-25 DIAGNOSIS — M545 Low back pain: Secondary | ICD-10-CM | POA: Insufficient documentation

## 2017-09-25 DIAGNOSIS — Z79899 Other long term (current) drug therapy: Secondary | ICD-10-CM | POA: Insufficient documentation

## 2017-09-25 DIAGNOSIS — M25561 Pain in right knee: Secondary | ICD-10-CM | POA: Diagnosis not present

## 2017-09-25 DIAGNOSIS — R0789 Other chest pain: Secondary | ICD-10-CM | POA: Diagnosis not present

## 2017-09-25 MED ORDER — METHOCARBAMOL 500 MG PO TABS: 500.0000 mg | ORAL_TABLET | Freq: Two times a day (BID) | ORAL | 0 refills | Status: DC
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
## 2018-02-02 ENCOUNTER — Other Ambulatory Visit: Payer: Self-pay | Admitting: Otolaryngology

## 2018-02-02 DIAGNOSIS — D490 Neoplasm of unspecified behavior of digestive system: Secondary | ICD-10-CM

## 2018-02-14 ENCOUNTER — Ambulatory Visit
Admission: RE | Admit: 2018-02-14 | Discharge: 2018-02-14 | Disposition: A | Discharge status: Home / Self Care | Payer: Medicaid Other | Source: Ambulatory Visit | Attending: Otolaryngology | Admitting: Otolaryngology

## 2018-02-14 DIAGNOSIS — D490 Neoplasm of unspecified behavior of digestive system: Secondary | ICD-10-CM

## 2018-02-14 MED ORDER — IOPAMIDOL (ISOVUE-300) INJECTION 61%
75.0000 mL | Freq: Once | INTRAVENOUS | Status: AC | PRN
  Administered 2018-02-14: 75 mL via INTRAVENOUS

## 2018-03-20 ENCOUNTER — Emergency Department (HOSPITAL_COMMUNITY)
Admission: EM | Admit: 2018-03-20 | Discharge: 2018-03-20 | Disposition: A | Discharge status: Home / Self Care | Payer: Medicaid Other | Attending: Emergency Medicine | Admitting: Emergency Medicine

## 2018-03-20 ENCOUNTER — Other Ambulatory Visit: Payer: Self-pay

## 2018-03-20 ENCOUNTER — Emergency Department (HOSPITAL_COMMUNITY): Payer: Medicaid Other

## 2018-03-20 DIAGNOSIS — S8991XA Unspecified injury of right lower leg, initial encounter: Secondary | ICD-10-CM | POA: Diagnosis present

## 2018-03-20 DIAGNOSIS — Y9301 Activity, walking, marching and hiking: Secondary | ICD-10-CM | POA: Insufficient documentation

## 2018-03-20 DIAGNOSIS — Z7984 Long term (current) use of oral hypoglycemic drugs: Secondary | ICD-10-CM | POA: Insufficient documentation

## 2018-03-20 DIAGNOSIS — I10 Essential (primary) hypertension: Secondary | ICD-10-CM | POA: Diagnosis not present

## 2018-03-20 DIAGNOSIS — Z79899 Other long term (current) drug therapy: Secondary | ICD-10-CM | POA: Diagnosis not present

## 2018-03-20 DIAGNOSIS — S82001A Unspecified fracture of right patella, initial encounter for closed fracture: Secondary | ICD-10-CM | POA: Diagnosis not present

## 2018-03-20 DIAGNOSIS — Y999 Unspecified external cause status: Secondary | ICD-10-CM | POA: Insufficient documentation

## 2018-03-20 DIAGNOSIS — R600 Localized edema: Secondary | ICD-10-CM | POA: Insufficient documentation

## 2018-03-20 DIAGNOSIS — F17210 Nicotine dependence, cigarettes, uncomplicated: Secondary | ICD-10-CM | POA: Insufficient documentation

## 2018-03-20 DIAGNOSIS — Y929 Unspecified place or not applicable: Secondary | ICD-10-CM | POA: Diagnosis not present

## 2018-03-20 DIAGNOSIS — W108XXA Fall (on) (from) other stairs and steps, initial encounter: Secondary | ICD-10-CM | POA: Insufficient documentation

## 2018-03-20 MED ORDER — KETOROLAC TROMETHAMINE 15 MG/ML IJ SOLN
15.0000 mg | Freq: Once | INTRAMUSCULAR | Status: AC
  Administered 2018-03-20: 15 mg via INTRAMUSCULAR
  Filled 2018-03-20: qty 1

## 2018-03-20 MED ORDER — NAPROXEN 500 MG PO TABS: 500.0000 mg | ORAL_TABLET | Freq: Two times a day (BID) | ORAL | 0 refills | Status: DC

## 2018-03-20 NOTE — ED Notes (Signed)
Pt was able to ambulate with knee immobilizer and crutches, pt stated he felt better at ambulating than he did when he came in today.
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
## 2018-03-20 NOTE — ED Notes (Signed)
Patient verbalizes understanding of discharge instructions. Opportunity for questioning and answers were provided. Armband removed by staff, pt discharged from ED in wheelchair.  

## 2018-03-20 NOTE — ED Notes (Signed)
Patient transported to X-ray 

## 2018-03-20 NOTE — Discharge Instructions (Signed)
Continue taking home medications as prescribed. Use twice a day to help with pain and swelling. Use ice to help with your knee pain. Use the knee immobilizer when walking and crutches. Call orthopedics tomorrow to set up an appointment or further evaluation and management. Return to the emergency room with any new, worsening, concerning symptoms.

## 2018-03-20 NOTE — ED Provider Notes (Signed)
[facility] EMERGENCY DEPARTMENT Provider Note   CSN: [id number] Arrival date & time: 03/20/18  1837     History   Chief Complaint Chief Complaint  Patient presents with  . Knee Injury    HPI Douglas Melendez is a 62 y.o. male senting for evaluation of right knee pain.  Patient states he was going down the stairs when he tripped, fell forward and landed on his anterior right knee.  He reports acute onset right knee pain.  This occurred around noon today, approximately 7 hours prior to arrival.  He denies numbness or tingling.  He denies hitting his head or loss of consciousness.  He denies pain elsewhere.  Patient states he has been unable to walk due to the pain since.  Pain is worse with movement and palpation.  Nothing makes it better.  He has not taken anything for pain including Tylenol or ibuprofen, however he did take his home medication of Suboxone at 2:00 this afternoon.  He is not on blood thinners.  HPI  Past Medical History:  Diagnosis Date  . Hypertension     Patient Active Problem List   Diagnosis Date Noted  . Sedative, hypnotic or anxiolytic dependence, uncomplicated ([place]) 94/85/4627  . Anxiety 03/22/2016    No past surgical history on file.      Home Medications    Prior to Admission medications   Medication Sig Start Date End Date Taking? Authorizing Provider  albuterol (PROVENTIL HFA;VENTOLIN HFA) 108 (90 Base) MCG/ACT inhaler Inhale 2 puffs into the lungs every 6 (six) hours as needed for wheezing or shortness of breath.    [provider]  amLODipine (NORVASC) 10 MG tablet Take 10 mg by mouth daily.    [provider]  buprenorphine-naloxone (SUBOXONE) 8-2 MG SUBL SL tablet Place 1 tablet under the tongue 3 (three) times daily.    [provider]  gabapentin (NEURONTIN) 300 MG capsule Take 300 mg by mouth 3 (three) times daily.    [provider]  glucose blood (ACCU-CHEK ACTIVE STRIPS) test strip  Use as instructed 06/16/17   Hedges, Dellis Filbert, PA-C  hydrOXYzine (ATARAX/VISTARIL) 25 MG tablet Take 1 tablet (25 mg total) by mouth every 6 (six) hours as needed for anxiety. 03/22/16   Patrecia Pour, NP  lisinopril (PRINIVIL,ZESTRIL) 40 MG tablet Take by mouth. 01/27/16 01/26/17  [provider]  metFORMIN (GLUCOPHAGE) 1000 MG tablet Take 1,000 mg by mouth 2 (two) times daily with a meal.  01/27/16 01/26/17  [provider]  methocarbamol (ROBAXIN) 500 MG tablet Take 1 tablet (500 mg total) by mouth 2 (two) times daily. 09/25/17   Glyn Ade, PA-C  naproxen (NAPROSYN) 500 MG tablet Take 1 tablet (500 mg total) by mouth 2 (two) times daily with a meal. 03/20/18   Ethaniel Garfield, PA-C  potassium chloride 20 MEQ TBCR Take 20 mEq by mouth daily. 03/24/16   Horton, Barbette Hair, MD  tamsulosin (FLOMAX) 0.4 MG CAPS capsule Take by mouth. 01/27/16   [provider]  traZODone (DESYREL) 50 MG tablet Take 1 tablet (50 mg total) by mouth at bedtime. 03/24/16   Horton, Barbette Hair, MD    Family History No family history on file.  Social History Social History   Tobacco Use  . Smoking status: Current Every Day Smoker    Packs/day: 2.00    Types: Cigarettes  . Smokeless tobacco: Never Used  Substance Use Topics  . Alcohol use: No  . Drug  use: No     Allergies   Patient has no known allergies.   Review of Systems Review of Systems  Musculoskeletal: Positive for arthralgias and joint swelling.  Neurological: Negative for numbness.  Hematological: Does not bruise/bleed easily.     Physical Exam Updated Vital Signs BP 135/76   Pulse 86   Temp 98.5 F (36.9 C) (Oral)   Resp 16   Ht 6' (1.829 m)   Wt 106.6 kg   SpO2 94%   BMI 31.87 kg/m   Physical Exam  Constitutional: He is oriented to person, place, and time. He appears well-developed and well-nourished. No distress.  HENT:  Head: Normocephalic and atraumatic.  Eyes: EOM are normal.  Neck: Normal  range of motion.  Pulmonary/Chest: Effort normal.  Abdominal: He exhibits no distension.  Musculoskeletal: He exhibits edema and tenderness.  Swelling and contusion of the anterior right knee.  Tenderness palpation of the patellar tendon and anterior knee.  No tenderness palpation of lateral, medial, posterior knee.  Patient with limited range of motion due to pain, but able to perform a straight leg raise.  Pedal pulses intact bilaterally.  Sensation intact bilaterally.  Soft compartments.  Neurological: He is alert and oriented to person, place, and time. No sensory deficit.  Skin: Skin is warm. Capillary refill takes less than 2 seconds. No rash noted.  Psychiatric: He has a normal mood and affect.  Nursing note and vitals reviewed.    ED Treatments / Results  Labs (all labs ordered are listed, but only abnormal results are displayed) Labs Reviewed - No data to display  EKG None  Radiology Dg Knee Complete 4 Views Right  Result Date: 03/20/2018 CLINICAL DATA:  Fall today with knee pain and swelling, initial encounter EXAM: RIGHT KNEE - COMPLETE 4+ VIEW COMPARISON:  None. FINDINGS: Moderate joint effusion is noted. Some anterior soft tissue swelling is noted related to the recent injury. Diffuse vascular calcifications are seen. Mild degenerative changes are seen. Lucency is noted vertically in the lateral aspect of the patella suspicious for an undisplaced fracture. No other fractures are seen. IMPRESSION: Joint effusion with findings suspicious for a lateral patellar fracture Electronically Signed   By: Inez Catalina M.D.   On: 03/20/2018 20:17    Procedures Procedures (including critical care time)  Medications Ordered in ED Medications  ketorolac (TORADOL) 15 MG/ML injection 15 mg (15 mg Intramuscular Given 03/20/18 2127)     Initial Impression / Assessment and Plan / ED Course  I have reviewed the triage vital signs and the nursing notes.  Pertinent labs & imaging results  that were available during my care of the patient were reviewed by me and considered in my medical decision making (see chart for details).     Patient presenting for evaluation of anterior right knee pain.  Physical exam reassuring, he is neurovascularly intact.  Tenderness to palpation of the patella and patellar tendon.  Patient able to perform a straight leg raise, doubt complete patellar tendon rupture.  Will obtain x-rays and reassess.  Toradol given for pain.  X-rays show a possible patellar fracture.  This would be consistent with patient's exam and history.  As patient has cause for his pain, doubt tibial plateau fracture.  Will place patient in knee immobilizer and crutches.  Patient to follow-up with orthopedics for further evaluation and management.  Discussed continued use of home medication and NSAIDs for pain.  Discussed use of ice and rest.  At this time, patient appears  safe for discharge.  Return precautions given.  Patient states he understands and agrees plan.  Final Clinical Impressions(s) / ED Diagnoses   Final diagnoses:  Closed nondisplaced fracture of right patella, unspecified fracture morphology, initial encounter    ED Discharge Orders         Ordered    naproxen (NAPROSYN) 500 MG tablet  2 times daily with meals     03/20/18 2059           Franchot Heidelberg, PA-C 03/21/18 0011    Virgel Manifold, MD 03/24/18 1011

## 2018-03-31 DIAGNOSIS — S82001A Unspecified fracture of right patella, initial encounter for closed fracture: Secondary | ICD-10-CM | POA: Insufficient documentation

## 2018-07-04 DIAGNOSIS — G8929 Other chronic pain: Secondary | ICD-10-CM | POA: Insufficient documentation

## 2018-09-21 ENCOUNTER — Encounter (HOSPITAL_COMMUNITY): Payer: Self-pay | Admitting: Emergency Medicine

## 2018-09-21 ENCOUNTER — Emergency Department (HOSPITAL_COMMUNITY): Payer: Medicaid Other

## 2018-09-21 ENCOUNTER — Inpatient Hospital Stay (HOSPITAL_COMMUNITY)
Admission: EM | Admit: 2018-09-21 | Discharge: 2018-09-26 | DRG: 394 | Disposition: A | Discharge status: Home / Self Care | Payer: Medicaid Other | Attending: Internal Medicine | Admitting: Internal Medicine

## 2018-09-21 DIAGNOSIS — Z833 Family history of diabetes mellitus: Secondary | ICD-10-CM

## 2018-09-21 DIAGNOSIS — F17210 Nicotine dependence, cigarettes, uncomplicated: Secondary | ICD-10-CM | POA: Diagnosis present

## 2018-09-21 DIAGNOSIS — F411 Generalized anxiety disorder: Secondary | ICD-10-CM

## 2018-09-21 DIAGNOSIS — Z720 Tobacco use: Secondary | ICD-10-CM

## 2018-09-21 DIAGNOSIS — I152 Hypertension secondary to endocrine disorders: Secondary | ICD-10-CM | POA: Diagnosis present

## 2018-09-21 DIAGNOSIS — Z8249 Family history of ischemic heart disease and other diseases of the circulatory system: Secondary | ICD-10-CM

## 2018-09-21 DIAGNOSIS — N281 Cyst of kidney, acquired: Secondary | ICD-10-CM

## 2018-09-21 DIAGNOSIS — R112 Nausea with vomiting, unspecified: Secondary | ICD-10-CM | POA: Diagnosis not present

## 2018-09-21 DIAGNOSIS — K859 Acute pancreatitis without necrosis or infection, unspecified: Secondary | ICD-10-CM

## 2018-09-21 DIAGNOSIS — A084 Viral intestinal infection, unspecified: Secondary | ICD-10-CM | POA: Diagnosis present

## 2018-09-21 DIAGNOSIS — Z20828 Contact with and (suspected) exposure to other viral communicable diseases: Secondary | ICD-10-CM | POA: Diagnosis present

## 2018-09-21 DIAGNOSIS — E119 Type 2 diabetes mellitus without complications: Secondary | ICD-10-CM

## 2018-09-21 DIAGNOSIS — Z794 Long term (current) use of insulin: Secondary | ICD-10-CM

## 2018-09-21 DIAGNOSIS — K760 Fatty (change of) liver, not elsewhere classified: Secondary | ICD-10-CM | POA: Diagnosis present

## 2018-09-21 DIAGNOSIS — K861 Other chronic pancreatitis: Secondary | ICD-10-CM | POA: Diagnosis present

## 2018-09-21 DIAGNOSIS — T464X6A Underdosing of angiotensin-converting-enzyme inhibitors, initial encounter: Secondary | ICD-10-CM | POA: Diagnosis present

## 2018-09-21 DIAGNOSIS — F1290 Cannabis use, unspecified, uncomplicated: Secondary | ICD-10-CM | POA: Diagnosis present

## 2018-09-21 DIAGNOSIS — E1169 Type 2 diabetes mellitus with other specified complication: Secondary | ICD-10-CM | POA: Diagnosis present

## 2018-09-21 DIAGNOSIS — E1159 Type 2 diabetes mellitus with other circulatory complications: Secondary | ICD-10-CM | POA: Insufficient documentation

## 2018-09-21 DIAGNOSIS — I10 Essential (primary) hypertension: Secondary | ICD-10-CM

## 2018-09-21 DIAGNOSIS — R319 Hematuria, unspecified: Secondary | ICD-10-CM

## 2018-09-21 DIAGNOSIS — R1115 Cyclical vomiting syndrome unrelated to migraine: Principal | ICD-10-CM | POA: Diagnosis present

## 2018-09-21 DIAGNOSIS — N2889 Other specified disorders of kidney and ureter: Secondary | ICD-10-CM | POA: Diagnosis present

## 2018-09-21 DIAGNOSIS — I158 Other secondary hypertension: Secondary | ICD-10-CM | POA: Diagnosis present

## 2018-09-21 DIAGNOSIS — R8281 Pyuria: Secondary | ICD-10-CM | POA: Diagnosis not present

## 2018-09-21 DIAGNOSIS — F1199 Opioid use, unspecified with unspecified opioid-induced disorder: Secondary | ICD-10-CM

## 2018-09-21 DIAGNOSIS — F1190 Opioid use, unspecified, uncomplicated: Secondary | ICD-10-CM

## 2018-09-21 HISTORY — DX: Type 2 diabetes mellitus without complications: E11.9

## 2018-09-21 LAB — COMPREHENSIVE METABOLIC PANEL
ALT: 19 U/L (ref 0–44)
AST: 34 U/L (ref 15–41)
Albumin: 4.1 g/dL (ref 3.5–5.0)
Alkaline Phosphatase: 92 U/L (ref 38–126)
Anion gap: 16 — ABNORMAL HIGH (ref 5–15)
BUN: 9 mg/dL (ref 8–23)
CO2: 20 mmol/L — ABNORMAL LOW (ref 22–32)
Calcium: 9.3 mg/dL (ref 8.9–10.3)
Chloride: 100 mmol/L (ref 98–111)
Creatinine, Ser: 0.97 mg/dL (ref 0.61–1.24)
GFR calc Af Amer: >60 mL/min (ref >60)
GFR calc non Af Amer: >60 mL/min (ref >60)
Glucose, Bld: 168 mg/dL — ABNORMAL HIGH (ref 70–99)
Potassium: 3.8 mmol/L (ref 3.5–5.1)
Sodium: 136 mmol/L (ref 135–145)
Total Bilirubin: 0.8 mg/dL (ref 0.3–1.2)
Total Protein: 7.9 g/dL (ref 6.5–8.1)

## 2018-09-21 LAB — URINALYSIS, ROUTINE W REFLEX MICROSCOPIC
Bacteria, UA: NONE SEEN
Bilirubin Urine: NEGATIVE
Glucose, UA: >=500 mg/dL — AB
Ketones, ur: 20 mg/dL — AB
Leukocytes,Ua: NEGATIVE
Nitrite: NEGATIVE
Protein, ur: 100 mg/dL — AB
Specific Gravity, Urine: >1.046 — ABNORMAL HIGH (ref 1.005–1.030)
pH: 8 (ref 5.0–8.0)

## 2018-09-21 LAB — CBC WITH DIFFERENTIAL/PLATELET
Abs Immature Granulocytes: 0.11 10*3/uL — ABNORMAL HIGH (ref 0.00–0.07)
Basophils Absolute: 0.1 10*3/uL (ref 0.0–0.1)
Basophils Relative: 0 %
Eosinophils Absolute: 0 10*3/uL (ref 0.0–0.5)
Eosinophils Relative: 0 %
HCT: 48.2 % (ref 39.0–52.0)
Hemoglobin: 16.6 g/dL (ref 13.0–17.0)
Immature Granulocytes: 1 %
Lymphocytes Relative: 9 %
Lymphs Abs: 1.6 10*3/uL (ref 0.7–4.0)
MCH: 30.7 pg (ref 26.0–34.0)
MCHC: 34.4 g/dL (ref 30.0–36.0)
MCV: 89.1 fL (ref 80.0–100.0)
Monocytes Absolute: 1.2 10*3/uL — ABNORMAL HIGH (ref 0.1–1.0)
Monocytes Relative: 6 %
Neutro Abs: 15.9 10*3/uL — ABNORMAL HIGH (ref 1.7–7.7)
Neutrophils Relative %: 84 %
Platelets: 251 10*3/uL (ref 150–400)
RBC: 5.41 MIL/uL (ref 4.22–5.81)
RDW: 13.2 % (ref 11.5–15.5)
WBC: 18.9 10*3/uL — ABNORMAL HIGH (ref 4.0–10.5)
nRBC: 0 % (ref 0.0–0.2)

## 2018-09-21 LAB — CBG MONITORING, ED: Glucose-Capillary: 188 mg/dL — ABNORMAL HIGH (ref 70–99)

## 2018-09-21 LAB — SARS CORONAVIRUS 2 BY RT PCR (HOSPITAL ORDER, PERFORMED IN ~~LOC~~ HOSPITAL LAB): SARS Coronavirus 2: NEGATIVE

## 2018-09-21 LAB — HEMOGLOBIN A1C
Hgb A1c MFr Bld: 7.3 % — ABNORMAL HIGH (ref 4.8–5.6)
Mean Plasma Glucose: 162.81 mg/dL

## 2018-09-21 LAB — LIPASE, BLOOD: Lipase: 29 U/L (ref 11–51)

## 2018-09-21 MED ORDER — BUPRENORPHINE HCL-NALOXONE HCL 8-2 MG SL SUBL
1.0000 | SUBLINGUAL_TABLET | Freq: Three times a day (TID) | SUBLINGUAL | Status: DC
  Administered 2018-09-22 – 2018-09-26 (×15): 1 via SUBLINGUAL
  Filled 2018-09-21 (×15): qty 1

## 2018-09-21 MED ORDER — ONDANSETRON HCL 4 MG/2ML IJ SOLN
4.0000 mg | Freq: Once | INTRAMUSCULAR | Status: AC
  Administered 2018-09-21: 4 mg via INTRAVENOUS
  Filled 2018-09-21: qty 2

## 2018-09-21 MED ORDER — ACETAMINOPHEN 325 MG PO TABS
650.0000 mg | ORAL_TABLET | Freq: Four times a day (QID) | ORAL | Status: DC | PRN
  Administered 2018-09-22 – 2018-09-24 (×3): 650 mg via ORAL
  Filled 2018-09-21 (×4): qty 2

## 2018-09-21 MED ORDER — LISINOPRIL 40 MG PO TABS: 40.0000 mg | ORAL_TABLET | Freq: Every day | ORAL | 11 refills | Status: AC

## 2018-09-21 MED ORDER — AMLODIPINE BESYLATE 5 MG PO TABS: 10.0000 mg | ORAL_TABLET | Freq: Every day | ORAL | Status: DC

## 2018-09-21 MED ORDER — LISINOPRIL 20 MG PO TABS: 40.0000 mg | ORAL_TABLET | Freq: Every day | ORAL | Status: DC

## 2018-09-21 MED ORDER — GABAPENTIN 300 MG PO CAPS
300.0000 mg | ORAL_CAPSULE | Freq: Three times a day (TID) | ORAL | Status: DC
  Administered 2018-09-22 – 2018-09-26 (×15): 300 mg via ORAL
  Filled 2018-09-21 (×15): qty 1

## 2018-09-21 MED ORDER — MORPHINE SULFATE (PF) 4 MG/ML IV SOLN: 4.0000 mg | Freq: Once | INTRAVENOUS | Status: DC

## 2018-09-21 MED ORDER — HEPARIN SODIUM (PORCINE) 5000 UNIT/ML IJ SOLN
5000.0000 [IU] | Freq: Three times a day (TID) | INTRAMUSCULAR | Status: AC
  Administered 2018-09-22 – 2018-09-25 (×13): 5000 [IU] via SUBCUTANEOUS
  Filled 2018-09-21 (×13): qty 1

## 2018-09-21 MED ORDER — ONDANSETRON HCL 4 MG PO TABS: 4.0000 mg | ORAL_TABLET | Freq: Three times a day (TID) | ORAL | 0 refills | Status: DC | PRN

## 2018-09-21 MED ORDER — AMLODIPINE BESYLATE 10 MG PO TABS: 10.0000 mg | ORAL_TABLET | Freq: Every day | ORAL | 0 refills | Status: AC

## 2018-09-21 MED ORDER — INSULIN ASPART 100 UNIT/ML ~~LOC~~ SOLN
0.0000 [IU] | SUBCUTANEOUS | Status: DC
  Administered 2018-09-22: 1 [IU] via SUBCUTANEOUS
  Administered 2018-09-22 (×2): 2 [IU] via SUBCUTANEOUS
  Administered 2018-09-22 (×3): 3 [IU] via SUBCUTANEOUS
  Administered 2018-09-23 (×2): 2 [IU] via SUBCUTANEOUS
  Administered 2018-09-23: 1 [IU] via SUBCUTANEOUS
  Administered 2018-09-24: 01:00:00 2 [IU] via SUBCUTANEOUS
  Administered 2018-09-24: 7 [IU] via SUBCUTANEOUS
  Administered 2018-09-24: 1 [IU] via SUBCUTANEOUS
  Administered 2018-09-24: 2 [IU] via SUBCUTANEOUS
  Administered 2018-09-24: 1 [IU] via SUBCUTANEOUS
  Administered 2018-09-24 – 2018-09-25 (×3): 3 [IU] via SUBCUTANEOUS
  Administered 2018-09-25 (×3): 2 [IU] via SUBCUTANEOUS

## 2018-09-21 MED ORDER — ONDANSETRON HCL 4 MG PO TABS: 4.0000 mg | ORAL_TABLET | Freq: Four times a day (QID) | ORAL | Status: DC | PRN

## 2018-09-21 MED ORDER — POTASSIUM CHLORIDE IN NACL 20-0.9 MEQ/L-% IV SOLN
INTRAVENOUS | Status: AC
  Administered 2018-09-22: 02:00:00 via INTRAVENOUS
  Filled 2018-09-21 (×3): qty 1000

## 2018-09-21 MED ORDER — NICOTINE 21 MG/24HR TD PT24
21.0000 mg | MEDICATED_PATCH | Freq: Every day | TRANSDERMAL | Status: DC
  Administered 2018-09-22 – 2018-09-26 (×6): 21 mg via TRANSDERMAL
  Filled 2018-09-21 (×6): qty 1

## 2018-09-21 MED ORDER — ACETAMINOPHEN 650 MG RE SUPP: 650.0000 mg | Freq: Four times a day (QID) | RECTAL | Status: DC | PRN

## 2018-09-21 MED ORDER — INSULIN GLARGINE 100 UNIT/ML ~~LOC~~ SOLN
5.0000 [IU] | Freq: Every day | SUBCUTANEOUS | Status: DC
  Administered 2018-09-22 – 2018-09-26 (×5): 5 [IU] via SUBCUTANEOUS
  Filled 2018-09-21 (×5): qty 0.05

## 2018-09-21 MED ORDER — HYDRALAZINE HCL 20 MG/ML IJ SOLN
5.0000 mg | INTRAMUSCULAR | Status: DC | PRN
  Administered 2018-09-21: 5 mg via INTRAVENOUS
  Filled 2018-09-21: qty 1

## 2018-09-21 MED ORDER — IOHEXOL 300 MG/ML  SOLN
100.0000 mL | Freq: Once | INTRAMUSCULAR | Status: AC | PRN
  Administered 2018-09-21: 100 mL via INTRAVENOUS

## 2018-09-21 MED ORDER — LORAZEPAM 1 MG PO TABS
2.0000 mg | ORAL_TABLET | Freq: Once | ORAL | Status: AC
  Administered 2018-09-21: 2 mg via ORAL
  Filled 2018-09-21: qty 2

## 2018-09-21 MED ORDER — LORAZEPAM 2 MG/ML IJ SOLN
1.0000 mg | Freq: Once | INTRAMUSCULAR | Status: AC
  Administered 2018-09-21: 1 mg via INTRAVENOUS
  Filled 2018-09-21: qty 1

## 2018-09-21 MED ORDER — ONDANSETRON HCL 4 MG/2ML IJ SOLN
4.0000 mg | Freq: Four times a day (QID) | INTRAMUSCULAR | Status: DC | PRN
  Administered 2018-09-21 – 2018-09-23 (×2): 4 mg via INTRAVENOUS
  Filled 2018-09-21 (×2): qty 2

## 2018-09-21 MED ORDER — SODIUM CHLORIDE 0.9% FLUSH
3.0000 mL | Freq: Two times a day (BID) | INTRAVENOUS | Status: DC
  Administered 2018-09-22 – 2018-09-25 (×6): 3 mL via INTRAVENOUS

## 2018-09-21 MED ORDER — SODIUM CHLORIDE 0.9 % IV BOLUS
1000.0000 mL | Freq: Once | INTRAVENOUS | Status: AC
  Administered 2018-09-21: 1000 mL via INTRAVENOUS

## 2018-09-21 NOTE — ED Notes (Signed)
ED Provider at bedside. 

## 2018-09-21 NOTE — H&P (Signed)
History and Physical    Douglas Melendez TKW:[id number] DOB: 08/26/1955 DOA: 09/21/2018  PCP: [facility]  Patient coming from: Home  I have personally briefly reviewed patient's old medical records in [place]  Chief Complaint: Persistent nausea and vomiting  HPI: Douglas Melendez is a 63 y.o. male with medical history significant for type 2 diabetes, hypertension, generalized anxiety disorder, opioid use disorder, and tobacco use who presents to the ED with 3-4 days of persistent nausea, vomiting, diarrhea, and abdominal pain.  Symptoms have occurred throughout the day without any correlation to oral intake.  He reports nonbilious nonbloody emesis and nonbloody diarrhea.  He says symptoms worsened last night when he ate "a whole bunch" banana pudding.  He reports occasional marijuana use with last use on 09/18/2018.  He has had occasional lightheadedness with exertion.  He denies any history of early satiety.  He has been unable to keep down any of his home medications including his chronic Ativan, antihypertensives, and Suboxone.  He denies any NSAID use.  He denies any fevers, diaphoresis, chest pain, dyspnea, dysuria.  He is a chronic smoker of 1 pack/day.  He denies any alcohol use.  He denies any cocaine or IV drug use.  He denies any history of pancreatitis.  ED Course:  Initial vitals showed BP 206/91, pulse 68, RR 17, temp 100.1 Fahrenheit, SPO2 100% on room air.  Labs are notable for WBC 18.9, hemoglobin 16.6, platelets 251, potassium 3.8, BUN 9, creatinine 0.97, lipase 29, urinalysis negative for UTI, serum glucose 168.  LFTs are within normal limits.  SARS-CoV-2 testing is negative.  1 view chest x-ray showed an enlarged cardiac silhouette without focal consolidation.  CT abdomen/pelvis showed calcifications throughout the pancreas suspicious for chronic pancreatitis, fatty liver, sigmoid diverticulosis without diverticulitis, and a 4.8 cm  low-density mass in the midpole of the left kidney.  Patient was given IV Zofran followed by IV Ativan with continued nausea and inability maintain oral intake therefore the hospitalist service was consulted to admit for further evaluation and management.  Review of Systems: As per HPI otherwise 10 point review of systems negative.    Past Medical History:  Diagnosis Date   Diabetes mellitus without complication ([place])    Hypertension     No past surgical history on file.   reports that he has been smoking cigarettes. He has been smoking about 2.00 packs per day. He has never used smokeless tobacco. He reports current drug use. Drug: Marijuana. He reports that he does not drink alcohol.  No Known Allergies  Family History  Problem Relation Age of Onset   Diabetes Mother    Hypertension Mother    Diabetes Father    Hypertension Father      Prior to Admission medications   Medication Sig Start Date End Date Taking? Authorizing Provider  albuterol (PROVENTIL HFA;VENTOLIN HFA) 108 (90 Base) MCG/ACT inhaler Inhale 2 puffs into the lungs every 6 (six) hours as needed for wheezing or shortness of breath.    [provider]  amLODipine (NORVASC) 10 MG tablet Take 1 tablet (10 mg total) by mouth daily for 14 days. 09/21/18 10/05/18  Doneta Public, MD  buprenorphine-naloxone (SUBOXONE) 8-2 MG SUBL SL tablet Place 1 tablet under the tongue 3 (three) times daily.    [provider]  gabapentin (NEURONTIN) 300 MG capsule Take 300 mg by mouth 3 (three) times daily.    [provider]  glucose blood (ACCU-CHEK ACTIVE STRIPS)  test strip Use as instructed 06/16/17   Hedges, Dellis Filbert, PA-C  hydrOXYzine (ATARAX/VISTARIL) 25 MG tablet Take 1 tablet (25 mg total) by mouth every 6 (six) hours as needed for anxiety. 03/22/16   Patrecia Pour, NP  lisinopril (ZESTRIL) 40 MG tablet Take 1 tablet (40 mg total) by mouth daily. 09/21/18 09/21/19  Doneta Public, MD  metFORMIN  (GLUCOPHAGE) 1000 MG tablet Take 1,000 mg by mouth 2 (two) times daily with a meal.  01/27/16 01/26/17  [provider]  methocarbamol (ROBAXIN) 500 MG tablet Take 1 tablet (500 mg total) by mouth 2 (two) times daily. 09/25/17   Glyn Ade, PA-C  naproxen (NAPROSYN) 500 MG tablet Take 1 tablet (500 mg total) by mouth 2 (two) times daily with a meal. 03/20/18   Caccavale, Sophia, PA-C  ondansetron (ZOFRAN) 4 MG tablet Take 1 tablet (4 mg total) by mouth every 8 (eight) hours as needed for nausea or vomiting. 09/21/18   Doneta Public, MD  potassium chloride 20 MEQ TBCR Take 20 mEq by mouth daily. 03/24/16   Horton, Barbette Hair, MD  tamsulosin (FLOMAX) 0.4 MG CAPS capsule Take by mouth. 01/27/16   [provider]  traZODone (DESYREL) 50 MG tablet Take 1 tablet (50 mg total) by mouth at bedtime. 03/24/16   Merryl Hacker, MD    Physical Exam: Vitals:   09/21/18 2200 09/21/18 2230 09/21/18 2316 09/21/18 2340  BP: (!) 187/92 (!) 182/82  (!) 188/105  Pulse: 68 69    Resp:      Temp:   99.2 F (37.3 C)   TempSrc:      SpO2: 98% 93%      Constitutional: Disheveled man sitting up in bed, somewhat anxious and appears uncomfortable and tired eyes: PERRL, lids and conjunctivae normal ENMT: Mucous membranes are dry. Posterior pharynx clear of any exudate or lesions.Normal dentition.  Neck: normal, supple, no masses. Respiratory: clear to auscultation bilaterally, no wheezing, no crackles. Normal respiratory effort. No accessory muscle use.  Cardiovascular: Regular rate and rhythm, no murmurs / rubs / gallops. No extremity edema. Abdomen: Generalized tenderness to palpation, no masses palpated. No hepatosplenomegaly. Bowel sounds positive.  Musculoskeletal: no clubbing / cyanosis. No joint deformity upper and lower extremities. Good ROM, no contractures. Normal muscle tone.  Skin: no rashes, lesions, ulcers. No induration Neurologic: CN 2-12 grossly intact. Sensation intact,  Strength 5/5 in all 4.  Psychiatric: Normal judgment and insight. Alert and oriented x 3. Normal mood.     Labs on Admission: I have personally reviewed following labs and imaging studies  CBC: Recent Labs  Lab 09/21/18 1711  WBC 18.9*  NEUTROABS 15.9*  HGB 16.6  HCT 48.2  MCV 89.1  PLT 852   Basic Metabolic Panel: Recent Labs  Lab 09/21/18 1711  NA 136  K 3.8  CL 100  CO2 20*  GLUCOSE 168*  BUN 9  CREATININE 0.97  CALCIUM 9.3   GFR: CrCl cannot be calculated (Unknown ideal weight.). Liver Function Tests: Recent Labs  Lab 09/21/18 1711  AST 34  ALT 19  ALKPHOS 92  BILITOT 0.8  PROT 7.9  ALBUMIN 4.1   Recent Labs  Lab 09/21/18 1711  LIPASE 29   No results for input(s): AMMONIA in the last 168 hours. Coagulation Profile: No results for input(s): INR, PROTIME in the last 168 hours. Cardiac Enzymes: No results for input(s): CKTOTAL, CKMB, CKMBINDEX, TROPONINI in the last 168 hours. BNP (last 3 results) No results for input(s): PROBNP  in the last 8760 hours. HbA1C: Recent Labs    09/21/18 2310  HGBA1C 7.3*   CBG: Recent Labs  Lab 09/21/18 2221  GLUCAP 188*   Lipid Profile: No results for input(s): CHOL, HDL, LDLCALC, TRIG, CHOLHDL, LDLDIRECT in the last 72 hours. Thyroid Function Tests: No results for input(s): TSH, T4TOTAL, FREET4, T3FREE, THYROIDAB in the last 72 hours. Anemia Panel: No results for input(s): VITAMINB12, FOLATE, FERRITIN, TIBC, IRON, RETICCTPCT in the last 72 hours. Urine analysis:    Component Value Date/Time   COLORURINE YELLOW 09/21/2018 [place] 09/21/2018 1948   LABSPEC >1.046 (H) 09/21/2018 1948   PHURINE 8.0 09/21/2018 1948   GLUCOSEU >=500 (A) 09/21/2018 1948   HGBUR SMALL (A) 09/21/2018 1948   BILIRUBINUR NEGATIVE 09/21/2018 1948   KETONESUR 20 (A) 09/21/2018 1948   PROTEINUR 100 (A) 09/21/2018 1948   UROBILINOGEN 0.2 03/01/2010 0330   NITRITE NEGATIVE 09/21/2018 1948   LEUKOCYTESUR NEGATIVE  09/21/2018 1948    Radiological Exams on Admission: Ct Abdomen Pelvis W Contrast  Result Date: 09/21/2018 CLINICAL DATA:  Generalized abdominal pain EXAM: CT ABDOMEN AND PELVIS WITH CONTRAST TECHNIQUE: Multidetector CT imaging of the abdomen and pelvis was performed using the standard protocol following bolus administration of intravenous contrast. CONTRAST:  126mL OMNIPAQUE IOHEXOL 300 MG/ML  SOLN COMPARISON:  06/27/2006 FINDINGS: Lower chest: Coronary artery calcifications diffusely. No acute abnormality. Hepatobiliary: Diffuse low-density throughout the liver compatible with fatty infiltration. No focal abnormality. Gallbladder unremarkable. Pancreas: Calcifications throughout the pancreas compatible with chronic pancreatitis. No ductal dilatation. Spleen: No focal abnormality.  Normal size. Adrenals/Urinary Tract: Ill-defined low-density lesion within the midpole of the left kidney measures 4.8 cm. Suggestion of internal septations and hands mint. Appearance is concerning for possible renal cell neoplasm. Other smaller scattered cysts in the left kidney. Fullness of the left adrenal gland compatible hyperplasia. No hydronephrosis. Urinary bladder unremarkable. Stomach/Bowel: Sigmoid diverticulosis. No active diverticulitis. Normal appendix. Stomach and small bowel decompressed, unremarkable. Vascular/Lymphatic: Aortic atherosclerosis. No enlarged abdominal or pelvic lymph nodes. Reproductive: Mildly prominent prostate. Other: No free fluid or free air. Musculoskeletal: Degenerative changes in the lumbar spine. No acute bony abnormality. IMPRESSION: Suspicious 4.8 cm low-density mass in the midpole of the left kidney. Cannot exclude renal cell neoplasm. Recommend further evaluation with MRI. Changes of chronic pancreatitis. Fatty infiltration of the liver. Diffuse coronary artery disease.  Aortic atherosclerosis. Sigmoid diverticulosis.  No active diverticulitis. Electronically Signed   By: Rolm Baptise M.D.    On: 09/21/2018 19:24   Dg Chest Portable 1 View  Result Date: 09/21/2018 CLINICAL DATA:  Cough EXAM: PORTABLE CHEST 1 VIEW COMPARISON:  03/01/2010 FINDINGS: Mild cardiomegaly with aortic atherosclerosis. Slight central vascular congestion. No pleural effusion. No pneumothorax. IMPRESSION: Cardiomegaly with slight central congestion. Electronically Signed   By: Donavan Foil M.D.   On: 09/21/2018 18:03    EKG: Independently reviewed. Sinus rhythm without acute ischemic changes, QTC 517.  Rate is slower and QTC increased when compared to prior.  Assessment/Plan Principal Problem:   Nausea and vomiting Active Problems:   Hypertension associated with diabetes ([place])   Diabetes mellitus without complication ([place])   Generalized anxiety disorder   Opioid use disorder ([place])   Tobacco use  Douglas Melendez is a 63 y.o. male with medical history significant for type 2 diabetes, hypertension, generalized anxiety disorder, opioid use disorder, and tobacco use who presents to the ED with 3-4 days of persistent nausea, vomiting, diarrhea, and abdominal pain   Nausea, vomiting,  and diarrhea: Unclear etiology, presumed viral gastroenteritis.  CT scan is suggestive of chronic pancreatitis, patient denies any personal history of prior pancreatitis.  Cyclic vomiting syndrome also possibility due to recent marijuana use.  Less likely gastroparesis. -Continue supportive care with antiemetics as needed, monitor QTC -Give 1 L bolus normal saline now followed by maintenance fluids while unable to tolerate oral medications -Pain control options limited due to history of opioid use disorder as patient wants to avoid narcotics.  Would like to limit NSAIDs for now.  Leukocytosis: No obvious bacterial infection, presumed reactive from above.  Accelerated hypertension: Due to inability to take home medications. -IV hydralazine as needed -Restart home lisinopril and amlodipine when able to tolerate oral  meds  Type 2 diabetes: A1c 13.0 06/03/2018.  Outpatient regimen includes metformin thousand milligrams twice daily and insulin degludec 10 units daily. -Repeat A1c is 7.3 -Lantus 5 units and sensitive SSI while in hospital, adjust as needed  Generalized anxiety disorder: Bupropion and as needed Xanax as an outpatient.  Has not been able to take either for the last several days. -Resume home meds when able  Opioid use disorder: On Suboxone as an outpatient.  Has not been taking due to fear of nausea/vomiting. -Recommend continuing Suboxone as this is sublingual may be easier for him to tolerate and his other meds  Tobacco use: -Nicotine patch ordered -Restart home bupropion when able  Left kidney mass: 4.8 cm low-density mass in the midpole of the left kidney seen on CT abdomen/pelvis.  Follow-up MRI is recommended.  DVT prophylaxis: subq heparin  Code Status: Full code, confirmed with patient Family Communication: None present on admission Disposition Plan: Likely discharge to home when able to maintain adequate oral intake Consults called: None Admission status: Observation   Zada Finders MD [facility] Pager [phone]  If 7PM-7AM, please contact night-coverage [email or web address]  09/21/2018, 11:46 PM

## 2018-09-21 NOTE — ED Notes (Signed)
Feels terrible med given iv

## 2018-09-21 NOTE — ED Notes (Signed)
Pt ambulated to RR .  

## 2018-09-21 NOTE — ED Notes (Signed)
rn on floor wants hydralazine given before the pt comes up  Delay  Getting upstairs  Med not verified orders placed while I gave report to rn on 5w

## 2018-09-21 NOTE — ED Notes (Signed)
Report given to rn on 5 w 

## 2018-09-21 NOTE — ED Triage Notes (Signed)
Pt arrives from home via [facility] with active vomiting and generalized abd pain. Pt was given 4mg  Zofran by ems with no relief. Pt also being out of BP meds for 3 days.

## 2018-09-21 NOTE — ED Notes (Signed)
Pt c/o pain and nausea still

## 2018-09-21 NOTE — ED Provider Notes (Signed)
[facility] Provider Note   CSN: [id number] Arrival date & time: 09/21/18  1646    History   Chief Complaint Chief Complaint  Patient presents with  . Emesis    HPI Douglas Melendez is a 63 y.o. male. With a pmhx of DM, HTN who presents to the ED with N/V and abdominal pain for 2-3 days. Initially started with abdominal pain that he describes as generalized cramping that does not radiate or localize. Yesterday started having emesis. Since then has thrown up 6 times. Emesis is NBNB. Nothing makes his symptoms better or worse. Denies any chest pain, headache, or fevers. Endorses running out of his blood pressure medication 3 days ago. Poor historian in regards to the medications he is on.     HPI  Past Medical History:  Diagnosis Date  . Diabetes mellitus without complication ([place])   . Hypertension     Patient Active Problem List   Diagnosis Date Noted  . Nausea and vomiting 09/21/2018  . Hypertension associated with diabetes ([place]) 09/21/2018  . Diabetes mellitus without complication ([place]) 63/78/5885  . Generalized anxiety disorder 09/21/2018  . Opioid use disorder ([place]) 09/21/2018  . Tobacco use 09/21/2018  . 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 Anxiety 03/22/2016    No past surgical history on file.      Home Medications    Prior to Admission medications   Medication Sig Start Date End Date Taking? Authorizing Provider  albuterol (PROVENTIL HFA;VENTOLIN HFA) 108 (90 Base) MCG/ACT inhaler Inhale 2 puffs into the lungs every 6 (six) hours as needed for wheezing or shortness of breath.    [provider]  amLODipine (NORVASC) 10 MG tablet Take 1 tablet (10 mg total) by mouth daily for 14 days. 09/21/18 10/05/18  Doneta Public, MD  buprenorphine-naloxone (SUBOXONE) 8-2 MG SUBL SL tablet Place 1 tablet under the tongue 3 (three) times daily.    [provider]  gabapentin  (NEURONTIN) 300 MG capsule Take 300 mg by mouth 3 (three) times daily.    [provider]  glucose blood (ACCU-CHEK ACTIVE STRIPS) test strip Use as instructed 06/16/17   Hedges, Dellis Filbert, PA-C  hydrOXYzine (ATARAX/VISTARIL) 25 MG tablet Take 1 tablet (25 mg total) by mouth every 6 (six) hours as needed for anxiety. 03/22/16   Patrecia Pour, NP  lisinopril (ZESTRIL) 40 MG tablet Take 1 tablet (40 mg total) by mouth daily. 09/21/18 09/21/19  Doneta Public, MD  metFORMIN (GLUCOPHAGE) 1000 MG tablet Take 1,000 mg by mouth 2 (two) times daily with a meal.  01/27/16 01/26/17  [provider]  methocarbamol (ROBAXIN) 500 MG tablet Take 1 tablet (500 mg total) by mouth 2 (two) times daily. 09/25/17   Glyn Ade, PA-C  naproxen (NAPROSYN) 500 MG tablet Take 1 tablet (500 mg total) by mouth 2 (two) times daily with a meal. 03/20/18   Caccavale, Sophia, PA-C  ondansetron (ZOFRAN) 4 MG tablet Take 1 tablet (4 mg total) by mouth every 8 (eight) hours as needed for nausea or vomiting. 09/21/18   Doneta Public, MD  potassium chloride 20 MEQ TBCR Take 20 mEq by mouth daily. 03/24/16   Horton, Barbette Hair, MD  tamsulosin (FLOMAX) 0.4 MG CAPS capsule Take by mouth. 01/27/16   [provider]  traZODone (DESYREL) 50 MG tablet Take 1 tablet (50 mg total) by mouth at bedtime. 03/24/16   Horton, Barbette Hair, MD    Family History Family History  Problem Relation Age of Onset  . Diabetes Mother   . Hypertension Mother   . Diabetes Father   . Hypertension Father     Social History Social History   Tobacco Use  . Smoking status: Current Every Day Smoker    Packs/day: 2.00    Types: Cigarettes  . Smokeless tobacco: Never Used  Substance Use Topics  . Alcohol use: No  . Drug use: Yes    Types: Marijuana    Comment: occasional marijuana     Allergies   Patient has no known allergies.   Review of Systems Review of Systems  Constitutional: Positive for activity change and appetite  change. Negative for chills and fever.  HENT: Negative for ear pain and sore throat.   Eyes: Negative for pain and visual disturbance.  Respiratory: Positive for cough. Negative for chest tightness and shortness of breath.   Cardiovascular: Negative for chest pain, palpitations and leg swelling.  Gastrointestinal: Positive for nausea and vomiting (6 episodes). Negative for abdominal pain, blood in stool, constipation (last BM 12 hours ago) and diarrhea.  Genitourinary: Negative for difficulty urinating, dysuria, hematuria, penile pain, scrotal swelling and testicular pain.  Musculoskeletal: Negative for arthralgias and back pain.  Skin: Negative for color change and rash.  Neurological: Negative for seizures, syncope and headaches.  All other systems reviewed and are negative.    Physical Exam Updated Vital Signs BP (!) 182/82   Pulse 69   Temp 100.1 F (37.8 C) (Oral)   Resp 13   SpO2 93%   Physical Exam Vitals signs and nursing note reviewed.  Constitutional:      General: He is not in acute distress.    Appearance: He is well-developed. He is obese. He is ill-appearing.  HENT:     Head: Normocephalic and atraumatic.  Eyes:     Conjunctiva/sclera: Conjunctivae normal.  Neck:     Musculoskeletal: Neck supple.  Cardiovascular:     Rate and Rhythm: Normal rate and regular rhythm.     Heart sounds: No murmur.  Pulmonary:     Effort: Pulmonary effort is normal. No respiratory distress.     Breath sounds: Normal breath sounds.  Abdominal:     General: There is distension.     Palpations: Abdomen is soft.     Tenderness: There is abdominal tenderness (generalized. does not localize ). There is no right CVA tenderness or rebound.     Hernia: No hernia is present.  Musculoskeletal:     Right lower leg: No edema.     Left lower leg: No edema.  Skin:    General: Skin is warm and dry.  Neurological:     Mental Status: He is alert and oriented to person, place, and time.       ED Treatments / Results  Labs (all labs ordered are listed, but only abnormal results are displayed) Labs Reviewed  CBC WITH DIFFERENTIAL/PLATELET - Abnormal; Notable for the following components:      Result Value   WBC 18.9 (*)    Neutro Abs 15.9 (*)    Monocytes Absolute 1.2 (*)    Abs Immature Granulocytes 0.11 (*)    All other components within normal limits  COMPREHENSIVE METABOLIC PANEL - Abnormal; Notable for the following components:   CO2 20 (*)    Glucose, Bld 168 (*)    Anion gap 16 (*)    All other components within normal limits  URINALYSIS, ROUTINE W REFLEX MICROSCOPIC - Abnormal; Notable for  the following components:   Specific Gravity, Urine >1.046 (*)    Glucose, UA >=500 (*)    Hgb urine dipstick SMALL (*)    Ketones, ur 20 (*)    Protein, ur 100 (*)    All other components within normal limits  CBG MONITORING, ED - Abnormal; Notable for the following components:   Glucose-Capillary 188 (*)    All other components within normal limits  SARS CORONAVIRUS 2 (HOSPITAL ORDER, [facility])  URINE CULTURE  LIPASE, BLOOD  HIV ANTIBODY (ROUTINE TESTING W REFLEX)  CBC  BASIC METABOLIC PANEL  RAPID URINE DRUG SCREEN, HOSP PERFORMED  HEMOGLOBIN A1C    EKG EKG Interpretation  Date/Time:  Wednesday Sep 21 2018 17:11:25 EDT Ventricular Rate:  66 PR Interval:    QRS Duration: 102 QT Interval:  493 QTC Calculation: 517 R Axis:   26 Text Interpretation:  Sinus rhythm Probable anteroseptal infarct, old Prolonged QT interval No significant change since last tracing Confirmed by Isla Pence [phone]) on 09/21/2018 5:18:06 PM   Radiology Ct Abdomen Pelvis W Contrast  Result Date: 09/21/2018 CLINICAL DATA:  Generalized abdominal pain EXAM: CT ABDOMEN AND PELVIS WITH CONTRAST TECHNIQUE: Multidetector CT imaging of the abdomen and pelvis was performed using the standard protocol following bolus administration of intravenous contrast.  CONTRAST:  139mL OMNIPAQUE IOHEXOL 300 MG/ML  SOLN COMPARISON:  06/27/2006 FINDINGS: Lower chest: Coronary artery calcifications diffusely. No acute abnormality. Hepatobiliary: Diffuse low-density throughout the liver compatible with fatty infiltration. No focal abnormality. Gallbladder unremarkable. Pancreas: Calcifications throughout the pancreas compatible with chronic pancreatitis. No ductal dilatation. Spleen: No focal abnormality.  Normal size. Adrenals/Urinary Tract: Ill-defined low-density lesion within the midpole of the left kidney measures 4.8 cm. Suggestion of internal septations and hands mint. Appearance is concerning for possible renal cell neoplasm. Other smaller scattered cysts in the left kidney. Fullness of the left adrenal gland compatible hyperplasia. No hydronephrosis. Urinary bladder unremarkable. Stomach/Bowel: Sigmoid diverticulosis. No active diverticulitis. Normal appendix. Stomach and small bowel decompressed, unremarkable. Vascular/Lymphatic: Aortic atherosclerosis. No enlarged abdominal or pelvic lymph nodes. Reproductive: Mildly prominent prostate. Other: No free fluid or free air. Musculoskeletal: Degenerative changes in the lumbar spine. No acute bony abnormality. IMPRESSION: Suspicious 4.8 cm low-density mass in the midpole of the left kidney. Cannot exclude renal cell neoplasm. Recommend further evaluation with MRI. Changes of chronic pancreatitis. Fatty infiltration of the liver. Diffuse coronary artery disease.  Aortic atherosclerosis. Sigmoid diverticulosis.  No active diverticulitis. Electronically Signed   By: Rolm Baptise M.D.   On: 09/21/2018 19:24   Dg Chest Portable 1 View  Result Date: 09/21/2018 CLINICAL DATA:  Cough EXAM: PORTABLE CHEST 1 VIEW COMPARISON:  03/01/2010 FINDINGS: Mild cardiomegaly with aortic atherosclerosis. Slight central vascular congestion. No pleural effusion. No pneumothorax. IMPRESSION: Cardiomegaly with slight central congestion. Electronically  Signed   By: Donavan Foil M.D.   On: 09/21/2018 18:03    Procedures Procedures (including critical care time)  Medications Ordered in ED Medications  heparin injection 5,000 Units (has no administration in time range)  sodium chloride flush (NS) 0.9 % injection 3 mL (has no administration in time range)  acetaminophen (TYLENOL) tablet 650 mg (has no administration in time range)    Or  acetaminophen (TYLENOL) suppository 650 mg (has no administration in time range)  ondansetron (ZOFRAN) tablet 4 mg (has no administration in time range)    Or  ondansetron (ZOFRAN) injection 4 mg (has no administration in time range)  sodium chloride 0.9 %  bolus 1,000 mL (has no administration in time range)  0.9 % NaCl with KCl 20 mEq/ L  infusion (has no administration in time range)  amLODipine (NORVASC) tablet 10 mg (has no administration in time range)  buprenorphine-naloxone (SUBOXONE) 8-2 mg per SL tablet 1 tablet (has no administration in time range)  gabapentin (NEURONTIN) capsule 300 mg (has no administration in time range)  lisinopril (ZESTRIL) tablet 40 mg (has no administration in time range)  hydrALAZINE (APRESOLINE) injection 5 mg (has no administration in time range)  nicotine (NICODERM CQ - dosed in mg/24 hours) patch 21 mg (has no administration in time range)  insulin aspart (novoLOG) injection 0-9 Units (has no administration in time range)  ondansetron (ZOFRAN) injection 4 mg (4 mg Intravenous Given 09/21/18 1710)  LORazepam (ATIVAN) tablet 2 mg (2 mg Oral Given 09/21/18 1725)  iohexol (OMNIPAQUE) 300 MG/ML solution 100 mL (100 mLs Intravenous Contrast Given 09/21/18 1853)  LORazepam (ATIVAN) injection 1 mg (1 mg Intravenous Given 09/21/18 2117)    Initial Impression / Assessment and Plan / ED Course  I have reviewed the triage vital signs and the nursing notes.  Pertinent labs & imaging results that were available during my care of the patient were reviewed by me and considered in my  medical decision making (see chart for details).      Douglas Melendez is a 63 y.o. male with a pmhx of DM, HTN who presents to the ED with N/V and abdominal pain for 2-3 days. Initially started with abdominal pain that he describes as generalized cramping that does not radiate or localize. Yesterday started having emesis. Since then has thrown up 6 times. Emesis is NBNB. Nothing makes his symptoms better or worse. Denies any chest pain, headache, or fevers. Endorses running out of his blood pressure medication 3 days ago. Poor historian in regards to the medications he is on.    On initial exam patient is ill appearing, not in acute distress. Temp 100.1, not tachycardic. Elevated BP 190/100. physical exam as above shows an obese male who is actively vomiting with diffuse abdominal TTP without peritonitic signs.   Will obtain labs, EKG, CXR and CT abd/pel.   ED interpretation of Labs: Elevated WBC 18. CMP unremarkable except for a bicarb of 20 ED interpretation of EKG: as above ED interpretation of Imaging: calcification of pancrease suggestive of chronic pancreatitis. Renal mass concerning for possible malignancy.   Medications given in ED: 4mg  zofran IV, 2mg  Ativan IV, 1mg  Ativan IV  Blood pressure improved after symptom control to 170/80. At this time will hold off on any IV medication.  Patient reassessed multiple times. He would have small improvements in vomiting with medications but was unable to keep anything down and expressed no change in his abdominal pain. At this time I feel symptoms warrant an admission.  Patient with likely gastritis vs gastroparesis given his DM.    Final Clinical Impressions(s) / ED Diagnoses   Final diagnoses:  Nausea and vomiting, intractability of vomiting not specified, unspecified vomiting type  Renal mass    ED Discharge Orders         Ordered    ondansetron (ZOFRAN) 4 MG tablet  Every 8 hours PRN     09/21/18 2051    amLODipine (NORVASC) 10  MG tablet  Daily     09/21/18 2051    lisinopril (ZESTRIL) 40 MG tablet  Daily     09/21/18 2051  Doneta Public, MD 09/21/18 2310    Isla Pence, MD 09/24/18 [phone]

## 2018-09-21 NOTE — Discharge Instructions (Signed)
Please follow up with your PCP about you CT finding of a renal cyst.

## 2018-09-21 NOTE — ED Notes (Signed)
P;t c/o abd pain still with little or no improvement

## 2018-09-22 ENCOUNTER — Other Ambulatory Visit: Payer: Self-pay

## 2018-09-22 DIAGNOSIS — Z8249 Family history of ischemic heart disease and other diseases of the circulatory system: Secondary | ICD-10-CM | POA: Diagnosis not present

## 2018-09-22 DIAGNOSIS — R8281 Pyuria: Secondary | ICD-10-CM | POA: Diagnosis not present

## 2018-09-22 DIAGNOSIS — F1290 Cannabis use, unspecified, uncomplicated: Secondary | ICD-10-CM | POA: Diagnosis present

## 2018-09-22 DIAGNOSIS — E119 Type 2 diabetes mellitus without complications: Secondary | ICD-10-CM

## 2018-09-22 DIAGNOSIS — F17210 Nicotine dependence, cigarettes, uncomplicated: Secondary | ICD-10-CM | POA: Diagnosis present

## 2018-09-22 DIAGNOSIS — R112 Nausea with vomiting, unspecified: Secondary | ICD-10-CM | POA: Diagnosis not present

## 2018-09-22 DIAGNOSIS — F411 Generalized anxiety disorder: Secondary | ICD-10-CM | POA: Diagnosis present

## 2018-09-22 DIAGNOSIS — T464X6A Underdosing of angiotensin-converting-enzyme inhibitors, initial encounter: Secondary | ICD-10-CM | POA: Diagnosis present

## 2018-09-22 DIAGNOSIS — A084 Viral intestinal infection, unspecified: Secondary | ICD-10-CM | POA: Diagnosis present

## 2018-09-22 DIAGNOSIS — K861 Other chronic pancreatitis: Secondary | ICD-10-CM | POA: Diagnosis present

## 2018-09-22 DIAGNOSIS — N2889 Other specified disorders of kidney and ureter: Secondary | ICD-10-CM | POA: Diagnosis present

## 2018-09-22 DIAGNOSIS — I152 Hypertension secondary to endocrine disorders: Secondary | ICD-10-CM | POA: Diagnosis present

## 2018-09-22 DIAGNOSIS — Z794 Long term (current) use of insulin: Secondary | ICD-10-CM | POA: Diagnosis not present

## 2018-09-22 DIAGNOSIS — Z20828 Contact with and (suspected) exposure to other viral communicable diseases: Secondary | ICD-10-CM | POA: Diagnosis present

## 2018-09-22 DIAGNOSIS — F1199 Opioid use, unspecified with unspecified opioid-induced disorder: Secondary | ICD-10-CM

## 2018-09-22 DIAGNOSIS — I158 Other secondary hypertension: Secondary | ICD-10-CM | POA: Diagnosis present

## 2018-09-22 DIAGNOSIS — I10 Essential (primary) hypertension: Secondary | ICD-10-CM | POA: Diagnosis not present

## 2018-09-22 DIAGNOSIS — Z833 Family history of diabetes mellitus: Secondary | ICD-10-CM | POA: Diagnosis not present

## 2018-09-22 DIAGNOSIS — E1169 Type 2 diabetes mellitus with other specified complication: Secondary | ICD-10-CM | POA: Diagnosis present

## 2018-09-22 DIAGNOSIS — R319 Hematuria, unspecified: Secondary | ICD-10-CM | POA: Diagnosis not present

## 2018-09-22 DIAGNOSIS — K760 Fatty (change of) liver, not elsewhere classified: Secondary | ICD-10-CM | POA: Diagnosis present

## 2018-09-22 DIAGNOSIS — R1115 Cyclical vomiting syndrome unrelated to migraine: Secondary | ICD-10-CM | POA: Diagnosis present

## 2018-09-22 DIAGNOSIS — F1190 Opioid use, unspecified, uncomplicated: Secondary | ICD-10-CM | POA: Diagnosis present

## 2018-09-22 DIAGNOSIS — N281 Cyst of kidney, acquired: Secondary | ICD-10-CM | POA: Diagnosis present

## 2018-09-22 LAB — CBC
HCT: 45.8 % (ref 39.0–52.0)
Hemoglobin: 15.8 g/dL (ref 13.0–17.0)
MCH: 30.4 pg (ref 26.0–34.0)
MCHC: 34.5 g/dL (ref 30.0–36.0)
MCV: 88.1 fL (ref 80.0–100.0)
Platelets: 243 10*3/uL (ref 150–400)
RBC: 5.2 MIL/uL (ref 4.22–5.81)
RDW: 13.1 % (ref 11.5–15.5)
WBC: 19.2 10*3/uL — ABNORMAL HIGH (ref 4.0–10.5)
nRBC: 0 % (ref 0.0–0.2)

## 2018-09-22 LAB — BASIC METABOLIC PANEL
Anion gap: 10 (ref 5–15)
BUN: 12 mg/dL (ref 8–23)
CO2: 22 mmol/L (ref 22–32)
Calcium: 8.8 mg/dL — ABNORMAL LOW (ref 8.9–10.3)
Chloride: 103 mmol/L (ref 98–111)
Creatinine, Ser: 0.81 mg/dL (ref 0.61–1.24)
GFR calc Af Amer: >60 mL/min (ref >60)
GFR calc non Af Amer: >60 mL/min (ref >60)
Glucose, Bld: 180 mg/dL — ABNORMAL HIGH (ref 70–99)
Potassium: 3.7 mmol/L (ref 3.5–5.1)
Sodium: 135 mmol/L (ref 135–145)

## 2018-09-22 LAB — RAPID URINE DRUG SCREEN, HOSP PERFORMED
Amphetamines: NOT DETECTED
Barbiturates: NOT DETECTED
Benzodiazepines: POSITIVE — AB
Cocaine: NOT DETECTED
Opiates: NOT DETECTED
Tetrahydrocannabinol: POSITIVE — AB

## 2018-09-22 LAB — GLUCOSE, CAPILLARY
Glucose-Capillary: 130 mg/dL — ABNORMAL HIGH (ref 70–99)
Glucose-Capillary: 169 mg/dL — ABNORMAL HIGH (ref 70–99)
Glucose-Capillary: 171 mg/dL — ABNORMAL HIGH (ref 70–99)
Glucose-Capillary: 186 mg/dL — ABNORMAL HIGH (ref 70–99)
Glucose-Capillary: 201 mg/dL — ABNORMAL HIGH (ref 70–99)
Glucose-Capillary: 206 mg/dL — ABNORMAL HIGH (ref 70–99)

## 2018-09-22 LAB — URINE CULTURE: Culture: NO GROWTH

## 2018-09-22 LAB — HIV ANTIBODY (ROUTINE TESTING W REFLEX): HIV Screen 4th Generation wRfx: NONREACTIVE

## 2018-09-22 MED ORDER — HYDRALAZINE HCL 20 MG/ML IJ SOLN: 10.0000 mg | INTRAMUSCULAR | Status: DC | PRN

## 2018-09-22 MED ORDER — ALPRAZOLAM 0.25 MG PO TABS
0.2500 mg | ORAL_TABLET | Freq: Once | ORAL | Status: AC
  Administered 2018-09-22: 0.25 mg via ORAL
  Filled 2018-09-22: qty 1

## 2018-09-22 MED ORDER — AMLODIPINE BESYLATE 5 MG PO TABS
5.0000 mg | ORAL_TABLET | Freq: Every day | ORAL | Status: DC
  Administered 2018-09-22 – 2018-09-26 (×5): 5 mg via ORAL
  Filled 2018-09-22 (×5): qty 1

## 2018-09-22 MED ORDER — SODIUM CHLORIDE 0.9 % IV SOLN
INTRAVENOUS | Status: DC
  Administered 2018-09-22 – 2018-09-25 (×3): via INTRAVENOUS

## 2018-09-22 MED ORDER — LISINOPRIL 20 MG PO TABS
20.0000 mg | ORAL_TABLET | Freq: Every day | ORAL | Status: DC
  Administered 2018-09-22 – 2018-09-26 (×5): 20 mg via ORAL
  Filled 2018-09-22 (×5): qty 1

## 2018-09-22 MED ORDER — METOCLOPRAMIDE HCL 5 MG/ML IJ SOLN: 5.0000 mg | Freq: Four times a day (QID) | INTRAMUSCULAR | Status: DC | PRN

## 2018-09-22 MED ORDER — ALPRAZOLAM 0.5 MG PO TABS
1.0000 mg | ORAL_TABLET | Freq: Three times a day (TID) | ORAL | Status: DC
  Administered 2018-09-22 – 2018-09-26 (×13): 1 mg via ORAL
  Filled 2018-09-22 (×13): qty 2

## 2018-09-22 MED ORDER — BUPROPION HCL ER (XL) 150 MG PO TB24
300.0000 mg | ORAL_TABLET | Freq: Every morning | ORAL | Status: DC
  Administered 2018-09-22 – 2018-09-26 (×5): 300 mg via ORAL
  Filled 2018-09-22 (×5): qty 2

## 2018-09-22 NOTE — Progress Notes (Signed)
Pt arrived to the floor in visible distress from his abdominal pain. Pt's BP was elevated 183/96, RN did ask ED to give prn's for BP before sending to the floor. Pt was alert & oriented x 4. Pt skin was intact. Pt did have a bruised on his lower abdomen on the left side, and his left leg. Pt stated that he fails often. Pt refused all pain medication due to taking suboxone. Pt's call bell was placed in his reach and he was instructed to call the RN  before attempting to get out of bed or for any other needs.

## 2018-09-22 NOTE — Progress Notes (Signed)
PROGRESS NOTE    Douglas Melendez  VQM:[id number] DOB: 1955-11-29 DOA: 09/21/2018 PCP: [facility]   Brief Narrative:  Douglas Melendez is a 63 y.o. male with medical history significant for type 2 diabetes, hypertension, generalized anxiety disorder, opioid use disorder, and tobacco use who presents to the ED with 3-4 days of persistent nausea, vomiting, diarrhea, and abdominal pain.  CT abdomen/pelvis showed calcifications throughout the pancreas suspicious for chronic pancreatitis, fatty liver, sigmoid diverticulosis without diverticulitis, and a 4.8 cm low-density mass in the midpole of the left kidney.  Assessment & Plan:   Principal Problem:   Nausea and vomiting Active Problems:   Hypertension associated with diabetes ([place])   Diabetes mellitus without complication (H[facility])   Generalized anxiety disorder   Opioid use disorder (HCC)   Tobacco use  Persistent nausea vomiting and diarrhea with some abdominal pain Differential includes viral gastroenteritis versus chronic pancreatitis versus cyclic vomiting syndrome and possibly  from polysubstance abuse Much improved when compared to yesterday. Patient continues to be nauseated with some dry gagging. No diarrhea today, but persistent abdominal pain is about 5-6 out of 10.  Patient reports pain is better controlled with Suboxone sublingual. Recommend gentle hydration for another 24 hours with IV antiemetics and advance diet from clear liquid diet to full liquid diet today.  If patient continues to improve then we can advance diet to soft diet tomorrow and possibly discharge tomorrow.     Leukocytosis Patient remains afebrile and the leukocytosis probably reactive.     Accelerated hypertension Much improved restarted home lisinopril and amlodipine.   Left renal mass: 4.8 cm low-density mass in the midpole of the left kidney. MRI of the abdomen and pelvis ordered.   Type 2 diabetes mellitus Patient on  5 units of Lantus and continue with sliding scale insulin while inpatient.   Opiate use disorder Recommend continuing Suboxone   Generalized anxiety disorder recommend continue with bupropion and Xanax.     DVT prophylaxis:  Code Status:  Full code Family Communication:  Disposition Plan:   Consultants:   None  Procedures:   None  Antimicrobials:  None.  Subjective: Patient reports 5-6 out of 10 abdominal pain, dry retching but no vomiting no vomiting persistently nauseated.  Not able to eat anything today except for some sips of water  Objective: Vitals:   09/22/18 0031 09/22/18 0139 09/22/18 0501 09/22/18 1409  BP: (!) 183/96  (!) 155/103 (!) 174/94  Pulse: 76  (!) 115 82  Resp: 16  18   Temp: 98.6 F (37 C)  98.4 F (36.9 C) 98.7 F (37.1 C)  TempSrc: Oral Oral Oral Oral  SpO2: 99%  96% 97%  Weight:  105.2 kg    Height:        Intake/Output Summary (Last 24 hours) at 09/22/2018 1609 Last data filed at 09/22/2018 0300 Gross per 24 hour  Intake 137.52 ml  Output 225 ml  Net -87.48 ml   Filed Weights   09/22/18 0016 09/22/18 0139  Weight: 105.2 kg 105.2 kg    Examination:  General exam: Appears in mild distress from abdominal pain.  Respiratory system: Clear to auscultation. Respiratory effort normal. Cardiovascular system: S1 & S2 heard, RRR. No JVD, murmurs, Gastrointestinal system: Abdomen is nondistended, soft, tender generalized, no sings of peritonitis.,  bowel sounds normal. . Central nervous system: Alert and oriented. No focal neurological deficits. Extremities: Symmetric 5 x 5 power. Skin: No rashes, lesions or ulcers Psychiatry: slightly anxious.  Data Reviewed: I have personally reviewed following labs and imaging studies  CBC: Recent Labs  Lab 09/21/18 1711 09/22/18 0250  WBC 18.9* 19.2*  NEUTROABS 15.9*  --   HGB 16.6 15.8  HCT 48.2 45.8  MCV 89.1 88.1  PLT 251 440   Basic Metabolic Panel: Recent Labs  Lab  09/21/18 1711 09/22/18 0250  NA 136 135  K 3.8 3.7  CL 100 103  CO2 20* 22  GLUCOSE 168* 180*  BUN 9 12  CREATININE 0.97 0.81  CALCIUM 9.3 8.8*   GFR: Estimated Creatinine Clearance: 115.5 mL/min (by C-G formula based on SCr of 0.81 mg/dL). Liver Function Tests: Recent Labs  Lab 09/21/18 1711  AST 34  ALT 19  ALKPHOS 92  BILITOT 0.8  PROT 7.9  ALBUMIN 4.1   Recent Labs  Lab 09/21/18 1711  LIPASE 29   No results for input(s): AMMONIA in the last 168 hours. Coagulation Profile: No results for input(s): INR, PROTIME in the last 168 hours. Cardiac Enzymes: No results for input(s): CKTOTAL, CKMB, CKMBINDEX, TROPONINI in the last 168 hours. BNP (last 3 results) No results for input(s): PROBNP in the last 8760 hours. HbA1C: Recent Labs    09/21/18 2310  HGBA1C 7.3*   CBG: Recent Labs  Lab 09/21/18 2221 09/22/18 0024 09/22/18 0443 09/22/18 0809 09/22/18 1204  GLUCAP 188* 186* 171* 130* 169*   Lipid Profile: No results for input(s): CHOL, HDL, LDLCALC, TRIG, CHOLHDL, LDLDIRECT in the last 72 hours. Thyroid Function Tests: No results for input(s): TSH, T4TOTAL, FREET4, T3FREE, THYROIDAB in the last 72 hours. Anemia Panel: No results for input(s): VITAMINB12, FOLATE, FERRITIN, TIBC, IRON, RETICCTPCT in the last 72 hours. Sepsis Labs: No results for input(s): PROCALCITON, LATICACIDVEN in the last 168 hours.  Recent Results (from the past 240 hour(s))  SARS Coronavirus 2 (CEPHEID - Performed in [place] hospital lab), Hosp Order     Status: None   Collection Time: 09/21/18  5:11 PM  Result Value Ref Range Status   SARS Coronavirus 2 NEGATIVE NEGATIVE Final    Comment: (NOTE) If result is NEGATIVE SARS-CoV-2 target nucleic acids are NOT DETECTED. The SARS-CoV-2 RNA is generally detectable in upper and lower  respiratory specimens during the acute phase of infection. The lowest  concentration of SARS-CoV-2 viral copies this assay can detect is 250  copies  / mL. A negative result does not preclude SARS-CoV-2 infection  and should not be used as the sole basis for treatment or other  patient management decisions.  A negative result may occur with  improper specimen collection / handling, submission of specimen other  than nasopharyngeal swab, presence of viral mutation(s) within the  areas targeted by this assay, and inadequate number of viral copies  (<250 copies / mL). A negative result must be combined with clinical  observations, patient history, and epidemiological information. If result is POSITIVE SARS-CoV-2 target nucleic acids are DETECTED. The SARS-CoV-2 RNA is generally detectable in upper and lower  respiratory specimens dur ing the acute phase of infection.  Positive  results are indicative of active infection with SARS-CoV-2.  Clinical  correlation with patient history and other diagnostic information is  necessary to determine patient infection status.  Positive results do  not rule out bacterial infection or co-infection with other viruses. If result is PRESUMPTIVE POSTIVE SARS-CoV-2 nucleic acids MAY BE PRESENT.   A presumptive positive result was obtained on the submitted specimen  and confirmed on repeat testing.  While 2019 novel  coronavirus  (SARS-CoV-2) nucleic acids may be present in the submitted sample  additional confirmatory testing may be necessary for epidemiological  and / or clinical management purposes  to differentiate between  SARS-CoV-2 and other Sarbecovirus currently known to infect humans.  If clinically indicated additional testing with an alternate test  methodology [phone]) is advised. The SARS-CoV-2 RNA is generally  detectable in upper and lower respiratory sp ecimens during the acute  phase of infection. The expected result is Negative. Fact Sheet for Patients:  StrictlyIdeas.no Fact Sheet for Healthcare Providers: BankingDealers.co.za This test  is not yet approved or cleared by the [place] FDA and has been authorized for detection and/or diagnosis of SARS-CoV-2 by FDA under an Emergency Use Authorization (EUA).  This EUA will remain in effect (meaning this test can be used) for the duration of the COVID-19 declaration under Section 564(b)(1) of the Act, 21 U.S.C. section 360bbb-3(b)(1), unless the authorization is terminated or revoked sooner. Performed at [facility], [place] [address], [place], [place] [address]   Urine culture     Status: None   Collection Time: 09/21/18  8:05 PM  Result Value Ref Range Status   Specimen Description URINE, CLEAN CATCH  Final   Special Requests NONE  Final   Culture   Final    NO GROWTH Performed at [facility], [place] [address], [place], [place] [address]    Report Status 09/22/2018 FINAL  Final         Radiology Studies: Ct Abdomen Pelvis W Contrast  Result Date: 09/21/2018 CLINICAL DATA:  Generalized abdominal pain EXAM: CT ABDOMEN AND PELVIS WITH CONTRAST TECHNIQUE: Multidetector CT imaging of the abdomen and pelvis was performed using the standard protocol following bolus administration of intravenous contrast. CONTRAST:  162mL OMNIPAQUE IOHEXOL 300 MG/ML  SOLN COMPARISON:  06/27/2006 FINDINGS: Lower chest: Coronary artery calcifications diffusely. No acute abnormality. Hepatobiliary: Diffuse low-density throughout the liver compatible with fatty infiltration. No focal abnormality. Gallbladder unremarkable. Pancreas: Calcifications throughout the pancreas compatible with chronic pancreatitis. No ductal dilatation. Spleen: No focal abnormality.  Normal size. Adrenals/Urinary Tract: Ill-defined low-density lesion within the midpole of the left kidney measures 4.8 cm. Suggestion of internal septations and hands mint. Appearance is concerning for possible renal cell neoplasm. Other smaller scattered cysts in the left kidney. Fullness of the left adrenal gland compatible  hyperplasia. No hydronephrosis. Urinary bladder unremarkable. Stomach/Bowel: Sigmoid diverticulosis. No active diverticulitis. Normal appendix. Stomach and small bowel decompressed, unremarkable. Vascular/Lymphatic: Aortic atherosclerosis. No enlarged abdominal or pelvic lymph nodes. Reproductive: Mildly prominent prostate. Other: No free fluid or free air. Musculoskeletal: Degenerative changes in the lumbar spine. No acute bony abnormality. IMPRESSION: Suspicious 4.8 cm low-density mass in the midpole of the left kidney. Cannot exclude renal cell neoplasm. Recommend further evaluation with MRI. Changes of chronic pancreatitis. Fatty infiltration of the liver. Diffuse coronary artery disease.  Aortic atherosclerosis. Sigmoid diverticulosis.  No active diverticulitis. Electronically Signed   By: Rolm Baptise M.D.   On: 09/21/2018 19:24   Dg Chest Portable 1 View  Result Date: 09/21/2018 CLINICAL DATA:  Cough EXAM: PORTABLE CHEST 1 VIEW COMPARISON:  03/01/2010 FINDINGS: Mild cardiomegaly with aortic atherosclerosis. Slight central vascular congestion. No pleural effusion. No pneumothorax. IMPRESSION: Cardiomegaly with slight central congestion. Electronically Signed   By: Donavan Foil M.D.   On: 09/21/2018 18:03        Scheduled Meds:  ALPRAZolam  1 mg Oral TID   buprenorphine-naloxone  1 tablet Sublingual TID  buPROPion  300 mg Oral q morning - 10a   gabapentin  300 mg Oral TID   heparin  5,000 Units Subcutaneous Q8H   insulin aspart  0-9 Units Subcutaneous Q4H   insulin glargine  5 Units Subcutaneous Daily   nicotine  21 mg Transdermal Daily   sodium chloride flush  3 mL Intravenous Q12H   Continuous Infusions:   LOS: 0 days    Time spent: 35 MINUTES.     Hosie Poisson, MD [facility] Pager [phone]   If 7PM-7AM, please contact night-coverage [email or web address] Password [email or web address] 09/22/2018, 4:09 PM

## 2018-09-23 ENCOUNTER — Inpatient Hospital Stay (HOSPITAL_COMMUNITY): Payer: Medicaid Other

## 2018-09-23 LAB — GLUCOSE, CAPILLARY
Glucose-Capillary: 109 mg/dL — ABNORMAL HIGH (ref 70–99)
Glucose-Capillary: 119 mg/dL — ABNORMAL HIGH (ref 70–99)
Glucose-Capillary: 129 mg/dL — ABNORMAL HIGH (ref 70–99)
Glucose-Capillary: 135 mg/dL — ABNORMAL HIGH (ref 70–99)
Glucose-Capillary: 154 mg/dL — ABNORMAL HIGH (ref 70–99)
Glucose-Capillary: 180 mg/dL — ABNORMAL HIGH (ref 70–99)

## 2018-09-23 LAB — CBC
HCT: 42.9 % (ref 39.0–52.0)
Hemoglobin: 14.5 g/dL (ref 13.0–17.0)
MCH: 30.2 pg (ref 26.0–34.0)
MCHC: 33.8 g/dL (ref 30.0–36.0)
MCV: 89.4 fL (ref 80.0–100.0)
Platelets: 235 10*3/uL (ref 150–400)
RBC: 4.8 MIL/uL (ref 4.22–5.81)
RDW: 13.4 % (ref 11.5–15.5)
WBC: 16 10*3/uL — ABNORMAL HIGH (ref 4.0–10.5)
nRBC: 0 % (ref 0.0–0.2)

## 2018-09-23 LAB — BASIC METABOLIC PANEL
Anion gap: 10 (ref 5–15)
BUN: 12 mg/dL (ref 8–23)
CO2: 21 mmol/L — ABNORMAL LOW (ref 22–32)
Calcium: 8.5 mg/dL — ABNORMAL LOW (ref 8.9–10.3)
Chloride: 104 mmol/L (ref 98–111)
Creatinine, Ser: 0.93 mg/dL (ref 0.61–1.24)
GFR calc Af Amer: >60 mL/min (ref >60)
GFR calc non Af Amer: >60 mL/min (ref >60)
Glucose, Bld: 151 mg/dL — ABNORMAL HIGH (ref 70–99)
Potassium: 3.8 mmol/L (ref 3.5–5.1)
Sodium: 135 mmol/L (ref 135–145)

## 2018-09-23 MED ORDER — GADOBUTROL 1 MMOL/ML IV SOLN
10.0000 mL | Freq: Once | INTRAVENOUS | Status: AC | PRN
  Administered 2018-09-23: 10 mL via INTRAVENOUS

## 2018-09-23 MED ORDER — PIPERACILLIN-TAZOBACTAM 3.375 G IVPB
3.3750 g | Freq: Three times a day (TID) | INTRAVENOUS | Status: DC
  Administered 2018-09-23 – 2018-09-26 (×10): 3.375 g via INTRAVENOUS
  Filled 2018-09-23 (×10): qty 50

## 2018-09-23 NOTE — Progress Notes (Signed)
PROGRESS NOTE    Douglas Melendez  KDT:[id number] DOB: Jul 19, 1955 DOA: 09/21/2018 PCP: Medicine, [facility]   Brief Narrative:  Douglas Melendez is a 63 y.o. male with medical history significant for type 2 diabetes, hypertension, generalized anxiety disorder, opioid use disorder, and tobacco use who presents to the ED with 3-4 days of persistent nausea, vomiting, diarrhea, and abdominal pain.  CT abdomen/pelvis showed calcifications throughout the pancreas suspicious for chronic pancreatitis, fatty liver, sigmoid diverticulosis without diverticulitis, and a 4.8 cm low-density mass in the midpole of the left kidney.  Assessment & Plan:   Principal Problem:   Nausea and vomiting Active Problems:   Hypertension associated with diabetes ([place])   Diabetes mellitus without complication (HCC)   Generalized anxiety disorder   Opioid use disorder (HCC)   Tobacco use  Persistent nausea vomiting and diarrhea with some abdominal pain Differential includes viral gastroenteritis versus chronic pancreatitis versus cyclic vomiting syndrome and possibly  from polysubstance abuse Much improved when compared to yesterday. Patient continues to be nauseated without any vomiting. Abdominal pain about 4 to 5/10 pt reports his pain is controlled with suboxone.  Recommend gentle hydration for another 24 hours with IV antiemetics and advance diet as tolerated.       Leukocytosis Patient remains afebrile and the leukocytosis is persistent.    Accelerated hypertension Much improved restarted home lisinopril and amlodipine.   Left renal mass: 4.8 cm low-density mass in the midpole of the left kidney. MRI of the abdomen and pelvis ordered, it showed to be a infected left renal cyst.  Discussed with DR Jeffie Pollock, recommended IR guided aspiration and antibiotics.  Will start the patient on IV zosyn, blood cultures drawn, .  Will need to spend the aspirate for culture .  Type 2 diabetes  mellitus Patient on 5 units of Lantus and continue with sliding scale insulin while inpatient. CBG (last 3)  Recent Labs    09/23/18 0825 09/23/18 1216 09/23/18 1735  GLUCAP 129* 119* 135*     Opiate use disorder Recommend continuing Suboxone   Generalized anxiety disorder recommend continue with bupropion and Xanax.     DVT prophylaxis:  Code Status:  Full code Family Communication: none at bedside.  Disposition Plan: pending clinical improvement.    Consultants:   None  Procedures:   None  Antimicrobials:  zosyn  Subjective: Nausea persistent, but no vomiting. Abd pain improved.  Started on soft diet today.   Objective: Vitals:   09/22/18 1409 09/22/18 2244 09/23/18 0549 09/23/18 1501  BP: (!) 174/94 134/83 134/88 (!) 158/87  Pulse: 82 84 88 (!) 105  Resp:      Temp: 98.7 F (37.1 C) 98.4 F (36.9 C) 99.1 F (37.3 C) 98.8 F (37.1 C)  TempSrc: Oral Oral Oral Oral  SpO2: 97%  96% 95%  Weight:      Height:        Intake/Output Summary (Last 24 hours) at 09/23/2018 1840 Last data filed at 09/23/2018 1513 Gross per 24 hour  Intake 819.6 ml  Output --  Net 819.6 ml   Filed Weights   09/22/18 0016 09/22/18 0139  Weight: 105.2 kg 105.2 kg    Examination:  General exam:  Pt reports nauseated from not eating all day  Respiratory system: Clear to auscultation. Respiratory effort normal. Cardiovascular system: S1 & S2 heard, RRR. No JVD, murmurs, Gastrointestinal system: Abdomen is nondistended, soft, tenderness persistent  generalized, no sings of peritonitis.,  bowel sounds normal. .  Central nervous system: Alert and oriented. No focal neurological deficits. Extremities: Symmetric 5 x 5 power. Skin: No rashes, lesions or ulcers Psychiatry: slightly anxious.     Data Reviewed: I have personally reviewed following labs and imaging studies  CBC: Recent Labs  Lab 09/21/18 1711 09/22/18 0250 09/23/18 0247  WBC 18.9* 19.2* 16.0*  NEUTROABS  15.9*  --   --   HGB 16.6 15.8 14.5  HCT 48.2 45.8 42.9  MCV 89.1 88.1 89.4  PLT 251 243 578   Basic Metabolic Panel: Recent Labs  Lab 09/21/18 1711 09/22/18 0250 09/23/18 0247  NA 136 135 135  K 3.8 3.7 3.8  CL 100 103 104  CO2 20* 22 21*  GLUCOSE 168* 180* 151*  BUN 9 12 12   CREATININE 0.97 0.81 0.93  CALCIUM 9.3 8.8* 8.5*   GFR: Estimated Creatinine Clearance: 100.6 mL/min (by C-G formula based on SCr of 0.93 mg/dL). Liver Function Tests: Recent Labs  Lab 09/21/18 1711  AST 34  ALT 19  ALKPHOS 92  BILITOT 0.8  PROT 7.9  ALBUMIN 4.1   Recent Labs  Lab 09/21/18 1711  LIPASE 29   No results for input(s): AMMONIA in the last 168 hours. Coagulation Profile: No results for input(s): INR, PROTIME in the last 168 hours. Cardiac Enzymes: No results for input(s): CKTOTAL, CKMB, CKMBINDEX, TROPONINI in the last 168 hours. BNP (last 3 results) No results for input(s): PROBNP in the last 8760 hours. HbA1C: Recent Labs    09/21/18 2310  HGBA1C 7.3*   CBG: Recent Labs  Lab 09/23/18 0004 09/23/18 0432 09/23/18 0825 09/23/18 1216 09/23/18 1735  GLUCAP 109* 154* 129* 119* 135*   Lipid Profile: No results for input(s): CHOL, HDL, LDLCALC, TRIG, CHOLHDL, LDLDIRECT in the last 72 hours. Thyroid Function Tests: No results for input(s): TSH, T4TOTAL, FREET4, T3FREE, THYROIDAB in the last 72 hours. Anemia Panel: No results for input(s): VITAMINB12, FOLATE, FERRITIN, TIBC, IRON, RETICCTPCT in the last 72 hours. Sepsis Labs: No results for input(s): PROCALCITON, LATICACIDVEN in the last 168 hours.  Recent Results (from the past 240 hour(s))  SARS Coronavirus 2 (CEPHEID - Performed in [place] hospital lab), Hosp Order     Status: None   Collection Time: 09/21/18  5:11 PM  Result Value Ref Range Status   SARS Coronavirus 2 NEGATIVE NEGATIVE Final    Comment: (NOTE) If result is NEGATIVE SARS-CoV-2 target nucleic acids are NOT DETECTED. The SARS-CoV-2 RNA is  generally detectable in upper and lower  respiratory specimens during the acute phase of infection. The lowest  concentration of SARS-CoV-2 viral copies this assay can detect is 250  copies / mL. A negative result does not preclude SARS-CoV-2 infection  and should not be used as the sole basis for treatment or other  patient management decisions.  A negative result may occur with  improper specimen collection / handling, submission of specimen other  than nasopharyngeal swab, presence of viral mutation(s) within the  areas targeted by this assay, and inadequate number of viral copies  (<250 copies / mL). A negative result must be combined with clinical  observations, patient history, and epidemiological information. If result is POSITIVE SARS-CoV-2 target nucleic acids are DETECTED. The SARS-CoV-2 RNA is generally detectable in upper and lower  respiratory specimens dur ing the acute phase of infection.  Positive  results are indicative of active infection with SARS-CoV-2.  Clinical  correlation with patient history and other diagnostic information is  necessary to determine patient infection  status.  Positive results do  not rule out bacterial infection or co-infection with other viruses. If result is PRESUMPTIVE POSTIVE SARS-CoV-2 nucleic acids MAY BE PRESENT.   A presumptive positive result was obtained on the submitted specimen  and confirmed on repeat testing.  While 2019 novel coronavirus  (SARS-CoV-2) nucleic acids may be present in the submitted sample  additional confirmatory testing may be necessary for epidemiological  and / or clinical management purposes  to differentiate between  SARS-CoV-2 and other Sarbecovirus currently known to infect humans.  If clinically indicated additional testing with an alternate test  methodology [phone]) is advised. The SARS-CoV-2 RNA is generally  detectable in upper and lower respiratory sp ecimens during the acute  phase of  infection. The expected result is Negative. Fact Sheet for Patients:  StrictlyIdeas.no Fact Sheet for Healthcare Providers: BankingDealers.co.za This test is not yet approved or cleared by the [place] FDA and has been authorized for detection and/or diagnosis of SARS-CoV-2 by FDA under an Emergency Use Authorization (EUA).  This EUA will remain in effect (meaning this test can be used) for the duration of the COVID-19 declaration under Section 564(b)(1) of the Act, 21 U.S.C. section 360bbb-3(b)(1), unless the authorization is terminated or revoked sooner. Performed at [facility], [place] [address] [place], [place] [address]   Urine culture     Status: None   Collection Time: 09/21/18  8:05 PM  Result Value Ref Range Status   Specimen Description URINE, CLEAN CATCH  Final   Special Requests NONE  Final   Culture   Final    NO GROWTH Performed at [facility], [place] [address], [place], [place] [address]    Report Status 09/22/2018 FINAL  Final         Radiology Studies: Ct Abdomen Pelvis W Contrast  Result Date: 09/21/2018 CLINICAL DATA:  Generalized abdominal pain EXAM: CT ABDOMEN AND PELVIS WITH CONTRAST TECHNIQUE: Multidetector CT imaging of the abdomen and pelvis was performed using the standard protocol following bolus administration of intravenous contrast. CONTRAST:  121mL OMNIPAQUE IOHEXOL 300 MG/ML  SOLN COMPARISON:  06/27/2006 FINDINGS: Lower chest: Coronary artery calcifications diffusely. No acute abnormality. Hepatobiliary: Diffuse low-density throughout the liver compatible with fatty infiltration. No focal abnormality. Gallbladder unremarkable. Pancreas: Calcifications throughout the pancreas compatible with chronic pancreatitis. No ductal dilatation. Spleen: No focal abnormality.  Normal size. Adrenals/Urinary Tract: Ill-defined low-density lesion within the midpole of the left kidney measures 4.8 cm.  Suggestion of internal septations and hands mint. Appearance is concerning for possible renal cell neoplasm. Other smaller scattered cysts in the left kidney. Fullness of the left adrenal gland compatible hyperplasia. No hydronephrosis. Urinary bladder unremarkable. Stomach/Bowel: Sigmoid diverticulosis. No active diverticulitis. Normal appendix. Stomach and small bowel decompressed, unremarkable. Vascular/Lymphatic: Aortic atherosclerosis. No enlarged abdominal or pelvic lymph nodes. Reproductive: Mildly prominent prostate. Other: No free fluid or free air. Musculoskeletal: Degenerative changes in the lumbar spine. No acute bony abnormality. IMPRESSION: Suspicious 4.8 cm low-density mass in the midpole of the left kidney. Cannot exclude renal cell neoplasm. Recommend further evaluation with MRI. Changes of chronic pancreatitis. Fatty infiltration of the liver. Diffuse coronary artery disease.  Aortic atherosclerosis. Sigmoid diverticulosis.  No active diverticulitis. Electronically Signed   By: Rolm Baptise M.D.   On: 09/21/2018 19:24   Mr 3d Recon At Scanner  Result Date: 09/23/2018 CLINICAL DATA:  Inpatient. Indeterminate left renal mass and chronic pancreatitis on recent CT abdomen study. EXAM: MRI ABDOMEN WITHOUT AND WITH CONTRAST (  INCLUDING MRCP) TECHNIQUE: Multiplanar multisequence MR imaging of the abdomen was performed both before and after the administration of intravenous contrast. Heavily T2-weighted images of the biliary and pancreatic ducts were obtained, and three-dimensional MRCP images were rendered by post processing. CONTRAST:  10 cc Gadavist IV. COMPARISON:  09/21/2018 CT abdomen/pelvis. FINDINGS: Lower chest: Minimal scarring versus atelectasis at the dependent left lung base. Hepatobiliary: Normal liver size and configuration. Moderate diffuse hepatic steatosis. Two scattered subcentimeter simple liver cysts. No suspicious liver masses. Normal gallbladder with no cholelithiasis. No biliary  ductal dilatation. Common bile duct diameter 6 mm. No evidence of choledocholithiasis. No biliary strictures or masses. Pancreas: There is mild diffuse pancreatic duct dilation (3 mm diameter) with a slightly beaded appearance in the pancreatic head. Pancreatic parenchyma is mildly decreased in T1 signal intensity. Findings are compatible with chronic pancreatitis. There are numerous cystic lesions scattered throughout the pancreas, most numerous in the pancreatic head, largest 1.4 x 1.3 x 1.4 cm in the uncinate process (series 12/image 23), none of which demonstrate wall thickening, solid enhancement or mural nodularity. No pancreas divisum. No peripancreatic edema or fluid collections. Spleen: Normal size spleen. 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 No additional splenic lesions. Adrenals/Urinary Tract: Diffuse thickening of both adrenal glands, left greater than right, without discrete adrenal nodules, suggesting adrenal hyperplasia. No hydronephrosis. There is a complex 5.8 x 3.8 cm medial interpolar left renal cyst (series 6/image 27), which demonstrates layering T1 hyperintense hemorrhagic/proteinaceous material and a thin internal septation, with no measurable enhancement or mural nodularity, compatible with a Bosniak category 2 hemorrhagic/proteinaceous renal cyst, which measured 3.8 x 2.4 cm on 06/27/2006 CT, increased in size. This complex interpolar left renal cyst appears adherent to the left psoas muscle with curvilinear asymmetric hyperenhancement along the surface of the left psoas muscle with associated surrounding asymmetry left perinephric fat stranding. No perinephric collections. Small simple renal cysts in both kidneys, largest 1.6 cm in the lower left kidney. No suspicious renal masses. Stomach/Bowel: Normal non-distended stomach. Visualized small and large bowel is normal caliber, with no bowel wall thickening. Vascular/Lymphatic:  Atherosclerotic nonaneurysmal abdominal aorta. Patent portal, splenic, hepatic and renal veins. No pathologically enlarged lymph nodes in the abdomen. Other: No abdominal ascites or focal fluid collection. Musculoskeletal: No aggressive appearing focal osseous lesions. IMPRESSION: 1. Complex hemorrhagic/proteinaceous septated 5.8 cm Bosniak category 2 renal cyst in the medial interpolar left kidney, which appears adherent to the left psoas muscle with asymmetric left perinephric fat stranding and curvilinear hyperenhancement along the surface of the left psoas muscle. Findings are nonspecific, but raise concern for an infected left renal cyst. Alternatively, findings could represent secondary reactive change to recent cyst rupture. 2. Chronic pancreatitis. No MRI findings of acute pancreatitis. 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 Follow-up MRI abdomen without and with IV contrast recommended in 12 months. This recommendation follows ACR consensus guidelines: Management of Incidental Pancreatic Cysts: A White Paper of the ACR Incidental Findings Committee. J Am Coll Radiol 3825;05:397-673. 3. No cholelithiasis. No biliary ductal dilatation. No choledocholithiasis. 4. Diffuse hepatic steatosis. 5.  Aortic Atherosclerosis (ICD10-I70.0). Electronically Signed   By: Ilona Sorrel M.D.   On: 09/23/2018 12:07   Mr Abdomen Mrcp Moise Boring Contast  Result Date: 09/23/2018 CLINICAL DATA:  Inpatient. Indeterminate left renal mass and chronic pancreatitis on recent CT abdomen study. EXAM: MRI ABDOMEN WITHOUT AND WITH CONTRAST (INCLUDING  MRCP) TECHNIQUE: Multiplanar multisequence MR imaging of the abdomen was performed both before and after the administration of intravenous contrast. Heavily T2-weighted images of the biliary and pancreatic ducts were obtained, and three-dimensional MRCP images were rendered by post  processing. CONTRAST:  10 cc Gadavist IV. COMPARISON:  09/21/2018 CT abdomen/pelvis. FINDINGS: Lower chest: Minimal scarring versus atelectasis at the dependent left lung base. Hepatobiliary: Normal liver size and configuration. Moderate diffuse hepatic steatosis. Two scattered subcentimeter simple liver cysts. No suspicious liver masses. Normal gallbladder with no cholelithiasis. No biliary ductal dilatation. Common bile duct diameter 6 mm. No evidence of choledocholithiasis. No biliary strictures or masses. Pancreas: There is mild diffuse pancreatic duct dilation (3 mm diameter) with a slightly beaded appearance in the pancreatic head. Pancreatic parenchyma is mildly decreased in T1 signal intensity. Findings are compatible with chronic pancreatitis. There are numerous cystic lesions scattered throughout the pancreas, most numerous in the pancreatic head, largest 1.4 x 1.3 x 1.4 cm in the uncinate process (series 12/image 23), none of which demonstrate wall thickening, solid enhancement or mural nodularity. No pancreas divisum. No peripancreatic edema or fluid collections. Spleen: Normal size spleen. 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 No additional splenic lesions. Adrenals/Urinary Tract: Diffuse thickening of both adrenal glands, left greater than right, without discrete adrenal nodules, suggesting adrenal hyperplasia. No hydronephrosis. There is a complex 5.8 x 3.8 cm medial interpolar left renal cyst (series 6/image 27), which demonstrates layering T1 hyperintense hemorrhagic/proteinaceous material and a thin internal septation, with no measurable enhancement or mural nodularity, compatible with a Bosniak category 2 hemorrhagic/proteinaceous renal cyst, which measured 3.8 x 2.4 cm on 06/27/2006 CT, increased in size. This complex interpolar left renal cyst appears adherent to the left psoas muscle with curvilinear asymmetric hyperenhancement along the  surface of the left psoas muscle with associated surrounding asymmetry left perinephric fat stranding. No perinephric collections. Small simple renal cysts in both kidneys, largest 1.6 cm in the lower left kidney. No suspicious renal masses. Stomach/Bowel: Normal non-distended stomach. Visualized small and large bowel is normal caliber, with no bowel wall thickening. Vascular/Lymphatic: Atherosclerotic nonaneurysmal abdominal aorta. Patent portal, splenic, hepatic and renal veins. No pathologically enlarged lymph nodes in the abdomen. Other: No abdominal ascites or focal fluid collection. Musculoskeletal: No aggressive appearing focal osseous lesions. IMPRESSION: 1. Complex hemorrhagic/proteinaceous septated 5.8 cm Bosniak category 2 renal cyst in the medial interpolar left kidney, which appears adherent to the left psoas muscle with asymmetric left perinephric fat stranding and curvilinear hyperenhancement along the surface of the left psoas muscle. Findings are nonspecific, but raise concern for an infected left renal cyst. Alternatively, findings could represent secondary reactive change to recent cyst rupture. 2. Chronic pancreatitis. No MRI findings of acute pancreatitis. 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 Follow-up MRI abdomen without and with IV contrast recommended in 12 months. This recommendation follows ACR consensus guidelines: Management of Incidental Pancreatic Cysts: A White Paper of the ACR Incidental Findings Committee. J Am Coll Radiol 7169;67:893-810. 3. No cholelithiasis. No biliary ductal dilatation. No choledocholithiasis. 4. Diffuse hepatic steatosis. 5.  Aortic Atherosclerosis (ICD10-I70.0). Electronically Signed   By: Ilona Sorrel M.D.   On: 09/23/2018 12:07        Scheduled Meds:  ALPRAZolam  1 mg Oral TID   amLODipine  5 mg Oral Daily   buprenorphine-naloxone  1  tablet Sublingual TID   buPROPion  300 mg Oral q morning - 10a   gabapentin  300 mg Oral TID   heparin  5,000 Units Subcutaneous Q8H   insulin aspart  0-9 Units Subcutaneous Q4H   insulin glargine  5 Units Subcutaneous Daily   lisinopril  20 mg Oral Daily   nicotine  21 mg Transdermal Daily   sodium chloride flush  3 mL Intravenous Q12H   Continuous Infusions:  sodium chloride 75 mL/hr at 09/22/18 1847   piperacillin-tazobactam (ZOSYN)  IV 3.375 g (09/23/18 1711)     LOS: 1 day    Time spent: 35 MINUTES.     Hosie Poisson, MD [facility] Pager [phone]   If 7PM-7AM, please contact night-coverage [email or web address] Password [facility] 09/23/2018, 6:40 PM

## 2018-09-23 NOTE — Progress Notes (Signed)
Pharmacy Antibiotic Note  Douglas Melendez is a 63 y.o. male admitted on 09/21/2018 with an infected renal cyst.  Pharmacy has been consulted for zosyn dosing.  Plan: Zosyn 3.375gm Iv q8h (EI) Monitor for de-escalation, cx, and clinical course  Height: 6' (182.9 cm) Weight: 231 lb 14.8 oz (105.2 kg) IBW/kg (Calculated) : 77.6  Temp (24hrs), Avg:98.7 F (37.1 C), Min:98.4 F (36.9 C), Max:99.1 F (37.3 C)  Recent Labs  Lab 09/21/18 1711 09/22/18 0250 09/23/18 0247  WBC 18.9* 19.2* 16.0*  CREATININE 0.97 0.81 0.93    Estimated Creatinine Clearance: 100.6 mL/min (by C-G formula based on SCr of 0.93 mg/dL).    No Known Allergies   Microbiology results: 5/8 BCx: NGTD   Shreshta Medley A. Levada Dy, PharmD, [place] Pager: [phone] Please utilize Amion for appropriate phone number to reach the unit pharmacist ([place])   09/23/2018 1:59 PM

## 2018-09-23 NOTE — Progress Notes (Signed)
  Patient ID: Douglas Melendez, male   DOB: December 25, 1955, 63 y.o.   MRN: [id number]   Request for renal cyst aspiration  Pt not npo today IR schedule is unable to accommodate today   Discussed with Dr Cleopatra Cedar Monday procedure is appropriate  Plan for Monday 5/11 See orders

## 2018-09-24 LAB — URINALYSIS, ROUTINE W REFLEX MICROSCOPIC
Bilirubin Urine: NEGATIVE
Glucose, UA: >=500 mg/dL — AB
Ketones, ur: NEGATIVE mg/dL
Nitrite: NEGATIVE
Protein, ur: 100 mg/dL — AB
RBC / HPF: >50 RBC/hpf — ABNORMAL HIGH (ref 0–5)
Specific Gravity, Urine: 1.012 (ref 1.005–1.030)
WBC, UA: >50 WBC/hpf — ABNORMAL HIGH (ref 0–5)
pH: 6 (ref 5.0–8.0)

## 2018-09-24 LAB — GLUCOSE, CAPILLARY
Glucose-Capillary: 133 mg/dL — ABNORMAL HIGH (ref 70–99)
Glucose-Capillary: 137 mg/dL — ABNORMAL HIGH (ref 70–99)
Glucose-Capillary: 156 mg/dL — ABNORMAL HIGH (ref 70–99)
Glucose-Capillary: 187 mg/dL — ABNORMAL HIGH (ref 70–99)
Glucose-Capillary: 188 mg/dL — ABNORMAL HIGH (ref 70–99)
Glucose-Capillary: 221 mg/dL — ABNORMAL HIGH (ref 70–99)
Glucose-Capillary: 325 mg/dL — ABNORMAL HIGH (ref 70–99)

## 2018-09-24 NOTE — Plan of Care (Signed)
  Problem: Education: Goal: Knowledge of General Education information will improve Description: Including pain rating scale, medication(s)/side effects and non-pharmacologic comfort measures Outcome: Progressing   Problem: Clinical Measurements: Goal: Diagnostic test results will improve Outcome: Progressing   Problem: Nutrition: Goal: Adequate nutrition will be maintained Outcome: Progressing   Problem: Pain Managment: Goal: General experience of comfort will improve Outcome: Progressing   Problem: Safety: Goal: Ability to remain free from injury will improve Outcome: Progressing   

## 2018-09-24 NOTE — Progress Notes (Signed)
PROGRESS NOTE    Douglas Melendez  EPP:[id number] DOB: Nov 08, 1955 DOA: 09/21/2018 PCP: Medicine, [facility]   Brief Narrative:  Douglas Melendez is a 63 y.o. male with medical history significant for type 2 diabetes, hypertension, generalized anxiety disorder, opioid use disorder, and tobacco use who presents to the ED with 3-4 days of persistent nausea, vomiting, diarrhea, and abdominal pain.  CT abdomen/pelvis showed calcifications throughout the pancreas suspicious for chronic pancreatitis, fatty liver, sigmoid diverticulosis without diverticulitis, and a 4.8 cm low-density mass in the midpole of the left kidney.  Assessment & Plan:   Principal Problem:   Nausea and vomiting Active Problems:   Hypertension associated with diabetes ([place])   Diabetes mellitus without complication (HCC)   Generalized anxiety disorder   Opioid use disorder (HCC)   Tobacco use  Persistent nausea vomiting and diarrhea with some abdominal pain Differential includes viral gastroenteritis versus chronic pancreatitis versus cyclic vomiting syndrome and possibly  from polysubstance abuse Much improved when compared to yesterday. Patient continues to be nauseated without any vomiting. Abdominal pain about 4 to 5/10 pt reports his pain is controlled with suboxone.  Recommend gentle hydration for another 24 hours with IV antiemetics and advance diet as tolerated.   He is currently on soft diet and tolerate it.      Leukocytosis, fever this am: Blood cultures pending , resume zosyn. Plan for IR guided aspiration of the infected left renal cyst.    Accelerated hypertension Much improved restarted home lisinopril and amlodipine.   Left renal mass: 4.8 cm low-density mass in the midpole of the left kidney. MRI of the abdomen and pelvis ordered, it showed to be a infected left renal cyst.  Discussed with Douglas Melendez, recommended IR guided aspiration and antibiotics.  Will start the patient on  IV zosyn, blood cultures drawn, .  Will need to spend the aspirate for culture .  Type 2 diabetes mellitus Patient on 5 units of Lantus and continue with sliding scale insulin while inpatient. CBG (last 3)  Recent Labs    09/24/18 0521 09/24/18 0908 09/24/18 1253  GLUCAP 133* 325* 137*     Opiate use disorder Recommend continuing Suboxone   Generalized anxiety disorder recommend continue with bupropion and Xanax.     DVT prophylaxis:  Code Status:  Full code Family Communication: none at bedside.  Disposition Plan: pending clinical improvement.    Consultants:   None  Procedures:   None  Antimicrobials:  zosyn  Subjective: No nausea or vomiting.  abd pain is about 5/10 but tolerable.  Objective: Vitals:   09/24/18 0000 09/24/18 0556 09/24/18 0558 09/24/18 0921  BP:  131/90 137/88 140/85  Pulse:  70 75 75  Resp:  18 16   Temp: 99.4 F (37.4 C) 99.2 F (37.3 C) 99 F (37.2 C)   TempSrc: Oral Oral Oral   SpO2:  96% 95%   Weight:      Height:        Intake/Output Summary (Last 24 hours) at 09/24/2018 1418 Last data filed at 09/24/2018 0400 Gross per 24 hour  Intake 298.3 ml  Output --  Net 298.3 ml   Filed Weights   09/22/18 0016 09/22/18 0139  Weight: 105.2 kg 105.2 kg    Examination:  General exam:  Nausea improved, he is alert and comfortable.  Respiratory system: Clear to auscultation. Respiratory effort normal. Cardiovascular system: S1 & S2 heard, RRR. No JVD, murmurs, Gastrointestinal system: Abdomen is non distended, soft,  tender ness improved. Bowel sounds good.  Central nervous system: Alert and oriented. Non focal Extremities: Symmetric 5 x 5 power. Skin: No rashes, lesions or ulcers Psychiatry: mood okay.    Data Reviewed: I have personally reviewed following labs and imaging studies  CBC: Recent Labs  Lab 09/21/18 1711 09/22/18 0250 09/23/18 0247  WBC 18.9* 19.2* 16.0*  NEUTROABS 15.9*  --   --   HGB 16.6 15.8 14.5   HCT 48.2 45.8 42.9  MCV 89.1 88.1 89.4  PLT 251 243 841   Basic Metabolic Panel: Recent Labs  Lab 09/21/18 1711 09/22/18 0250 09/23/18 0247  NA 136 135 135  K 3.8 3.7 3.8  CL 100 103 104  CO2 20* 22 21*  GLUCOSE 168* 180* 151*  BUN 9 12 12   CREATININE 0.97 0.81 0.93  CALCIUM 9.3 8.8* 8.5*   GFR: Estimated Creatinine Clearance: 100.6 mL/min (by C-G formula based on SCr of 0.93 mg/dL). Liver Function Tests: Recent Labs  Lab 09/21/18 1711  AST 34  ALT 19  ALKPHOS 92  BILITOT 0.8  PROT 7.9  ALBUMIN 4.1   Recent Labs  Lab 09/21/18 1711  LIPASE 29   No results for input(s): AMMONIA in the last 168 hours. Coagulation Profile: No results for input(s): INR, PROTIME in the last 168 hours. Cardiac Enzymes: No results for input(s): CKTOTAL, CKMB, CKMBINDEX, TROPONINI in the last 168 hours. BNP (last 3 results) No results for input(s): PROBNP in the last 8760 hours. HbA1C: Recent Labs    09/21/18 2310  HGBA1C 7.3*   CBG: Recent Labs  Lab 09/23/18 2201 09/24/18 0043 09/24/18 0521 09/24/18 0908 09/24/18 1253  GLUCAP 180* 156* 133* 325* 137*   Lipid Profile: No results for input(s): CHOL, HDL, LDLCALC, TRIG, CHOLHDL, LDLDIRECT in the last 72 hours. Thyroid Function Tests: No results for input(s): TSH, T4TOTAL, FREET4, T3FREE, THYROIDAB in the last 72 hours. Anemia Panel: No results for input(s): VITAMINB12, FOLATE, FERRITIN, TIBC, IRON, RETICCTPCT in the last 72 hours. Sepsis Labs: No results for input(s): PROCALCITON, LATICACIDVEN in the last 168 hours.  Recent Results (from the past 240 hour(s))  SARS Coronavirus 2 (CEPHEID - Performed in [facility] hospital lab), Hosp Order     Status: None   Collection Time: 09/21/18  5:11 PM  Result Value Ref Range Status   SARS Coronavirus 2 NEGATIVE NEGATIVE Final    Comment: (NOTE) If result is NEGATIVE SARS-CoV-2 target nucleic acids are NOT DETECTED. The SARS-CoV-2 RNA is generally detectable in upper and  lower  respiratory specimens during the acute phase of infection. The lowest  concentration of SARS-CoV-2 viral copies this assay can detect is 250  copies / mL. A negative result does not preclude SARS-CoV-2 infection  and should not be used as the sole basis for treatment or other  patient management decisions.  A negative result may occur with  improper specimen collection / handling, submission of specimen other  than nasopharyngeal swab, presence of viral mutation(s) within the  areas targeted by this assay, and inadequate number of viral copies  (<250 copies / mL). A negative result must be combined with clinical  observations, patient history, and epidemiological information. If result is POSITIVE SARS-CoV-2 target nucleic acids are DETECTED. The SARS-CoV-2 RNA is generally detectable in upper and lower  respiratory specimens dur ing the acute phase of infection.  Positive  results are indicative of active infection with SARS-CoV-2.  Clinical  correlation with patient history and other diagnostic information is  necessary  to determine patient infection status.  Positive results do  not rule out bacterial infection or co-infection with other viruses. If result is PRESUMPTIVE POSTIVE SARS-CoV-2 nucleic acids MAY BE PRESENT.   A presumptive positive result was obtained on the submitted specimen  and confirmed on repeat testing.  While 2019 novel coronavirus  (SARS-CoV-2) nucleic acids may be present in the submitted sample  additional confirmatory testing may be necessary for epidemiological  and / or clinical management purposes  to differentiate between  SARS-CoV-2 and other Sarbecovirus currently known to infect humans.  If clinically indicated additional testing with an alternate test  methodology [id number]) is advised. The SARS-CoV-2 RNA is generally  detectable in upper and lower respiratory sp ecimens during the acute  phase of infection. The expected result is  Negative. Fact Sheet for Patients:  StrictlyIdeas.no Fact Sheet for Healthcare Providers: BankingDealers.co.za This test is not yet approved or cleared by the [place] FDA and has been authorized for detection and/or diagnosis of SARS-CoV-2 by FDA under an Emergency Use Authorization (EUA).  This EUA will remain in effect (meaning this test can be used) for the duration of the COVID-19 declaration under Section 564(b)(1) of the Act, 21 U.S.C. section 360bbb-3(b)(1), unless the authorization is terminated or revoked sooner. Performed at [facility], [place] [address], [place], [place] [address]   Urine culture     Status: None   Collection Time: 09/21/18  8:05 PM  Result Value Ref Range Status   Specimen Description URINE, CLEAN CATCH  Final   Special Requests NONE  Final   Culture   Final    NO GROWTH Performed at [facility], [place] [address] [place], [place] [address]    Report Status 09/22/2018 FINAL  Final         Radiology Studies: Mr 3d Recon At Scanner  Result Date: 09/23/2018 CLINICAL DATA:  Inpatient. Indeterminate left renal mass and chronic pancreatitis on recent CT abdomen study. EXAM: MRI ABDOMEN WITHOUT AND WITH CONTRAST (INCLUDING MRCP) TECHNIQUE: Multiplanar multisequence MR imaging of the abdomen was performed both before and after the administration of intravenous contrast. Heavily T2-weighted images of the biliary and pancreatic ducts were obtained, and three-dimensional MRCP images were rendered by post processing. CONTRAST:  10 cc Gadavist IV. COMPARISON:  09/21/2018 CT abdomen/pelvis. FINDINGS: Lower chest: Minimal scarring versus atelectasis at the dependent left lung base. Hepatobiliary: Normal liver size and configuration. Moderate diffuse hepatic steatosis. Two scattered subcentimeter simple liver cysts. No suspicious liver masses. Normal gallbladder with no cholelithiasis. No biliary ductal  dilatation. Common bile duct diameter 6 mm. No evidence of choledocholithiasis. No biliary strictures or masses. Pancreas: There is mild diffuse pancreatic duct dilation (3 mm diameter) with a slightly beaded appearance in the pancreatic head. Pancreatic parenchyma is mildly decreased in T1 signal intensity. Findings are compatible with chronic pancreatitis. There are numerous cystic lesions scattered throughout the pancreas, most numerous in the pancreatic head, largest 1.4 x 1.3 x 1.4 cm in the uncinate process (series 12/image 23), none of which demonstrate wall thickening, solid enhancement or mural nodularity. No pancreas divisum. No peripancreatic edema or fluid collections. Spleen: Normal size spleen. 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 No additional splenic lesions. Adrenals/Urinary Tract: Diffuse thickening of both adrenal glands, left greater than right, without discrete adrenal nodules, suggesting adrenal hyperplasia. No hydronephrosis. There is a complex 5.8 x 3.8 cm medial interpolar left renal cyst (series 6/image 27), which demonstrates layering  T1 hyperintense hemorrhagic/proteinaceous material and a thin internal septation, with no measurable enhancement or mural nodularity, compatible with a Bosniak category 2 hemorrhagic/proteinaceous renal cyst, which measured 3.8 x 2.4 cm on 06/27/2006 CT, increased in size. This complex interpolar left renal cyst appears adherent to the left psoas muscle with curvilinear asymmetric hyperenhancement along the surface of the left psoas muscle with associated surrounding asymmetry left perinephric fat stranding. No perinephric collections. Small simple renal cysts in both kidneys, largest 1.6 cm in the lower left kidney. No suspicious renal masses. Stomach/Bowel: Normal non-distended stomach. Visualized small and large bowel is normal caliber, with no bowel wall thickening. Vascular/Lymphatic: Atherosclerotic  nonaneurysmal abdominal aorta. Patent portal, splenic, hepatic and renal veins. No pathologically enlarged lymph nodes in the abdomen. Other: No abdominal ascites or focal fluid collection. Musculoskeletal: No aggressive appearing focal osseous lesions. IMPRESSION: 1. Complex hemorrhagic/proteinaceous septated 5.8 cm Bosniak category 2 renal cyst in the medial interpolar left kidney, which appears adherent to the left psoas muscle with asymmetric left perinephric fat stranding and curvilinear hyperenhancement along the surface of the left psoas muscle. Findings are nonspecific, but raise concern for an infected left renal cyst. Alternatively, findings could represent secondary reactive change to recent cyst rupture. 2. Chronic pancreatitis. No MRI findings of acute pancreatitis. 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 Follow-up MRI abdomen without and with IV contrast recommended in 12 months. This recommendation follows ACR consensus guidelines: Management of Incidental Pancreatic Cysts: A White Paper of the ACR Incidental Findings Committee. J Am Coll Radiol 6387;56:433-295. 3. No cholelithiasis. No biliary ductal dilatation. No choledocholithiasis. 4. Diffuse hepatic steatosis. 5.  Aortic Atherosclerosis (ICD10-I70.0). Electronically Signed   By: Ilona Sorrel M.D.   On: 09/23/2018 12:07   Mr Abdomen Mrcp Moise Boring Contast  Result Date: 09/23/2018 CLINICAL DATA:  Inpatient. Indeterminate left renal mass and chronic pancreatitis on recent CT abdomen study. EXAM: MRI ABDOMEN WITHOUT AND WITH CONTRAST (INCLUDING MRCP) TECHNIQUE: Multiplanar multisequence MR imaging of the abdomen was performed both before and after the administration of intravenous contrast. Heavily T2-weighted images of the biliary and pancreatic ducts were obtained, and three-dimensional MRCP images were rendered by post processing.  CONTRAST:  10 cc Gadavist IV. COMPARISON:  09/21/2018 CT abdomen/pelvis. FINDINGS: Lower chest: Minimal scarring versus atelectasis at the dependent left lung base. Hepatobiliary: Normal liver size and configuration. Moderate diffuse hepatic steatosis. Two scattered subcentimeter simple liver cysts. No suspicious liver masses. Normal gallbladder with no cholelithiasis. No biliary ductal dilatation. Common bile duct diameter 6 mm. No evidence of choledocholithiasis. No biliary strictures or masses. Pancreas: There is mild diffuse pancreatic duct dilation (3 mm diameter) with a slightly beaded appearance in the pancreatic head. Pancreatic parenchyma is mildly decreased in T1 signal intensity. Findings are compatible with chronic pancreatitis. There are numerous cystic lesions scattered throughout the pancreas, most numerous in the pancreatic head, largest 1.4 x 1.3 x 1.4 cm in the uncinate process (series 12/image 23), none of which demonstrate wall thickening, solid enhancement or mural nodularity. No pancreas divisum. No peripancreatic edema or fluid collections. Spleen: Normal size spleen. 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 No additional splenic lesions. Adrenals/Urinary Tract: Diffuse thickening of both adrenal glands, left greater than right, without discrete adrenal nodules, suggesting adrenal hyperplasia. No hydronephrosis. There is a complex 5.8 x 3.8 cm medial interpolar left renal cyst (series 6/image 27), which demonstrates layering T1  hyperintense hemorrhagic/proteinaceous material and a thin internal septation, with no measurable enhancement or mural nodularity, compatible with a Bosniak category 2 hemorrhagic/proteinaceous renal cyst, which measured 3.8 x 2.4 cm on 06/27/2006 CT, increased in size. This complex interpolar left renal cyst appears adherent to the left psoas muscle with curvilinear asymmetric hyperenhancement along the surface of the  left psoas muscle with associated surrounding asymmetry left perinephric fat stranding. No perinephric collections. Small simple renal cysts in both kidneys, largest 1.6 cm in the lower left kidney. No suspicious renal masses. Stomach/Bowel: Normal non-distended stomach. Visualized small and large bowel is normal caliber, with no bowel wall thickening. Vascular/Lymphatic: Atherosclerotic nonaneurysmal abdominal aorta. Patent portal, splenic, hepatic and renal veins. No pathologically enlarged lymph nodes in the abdomen. Other: No abdominal ascites or focal fluid collection. Musculoskeletal: No aggressive appearing focal osseous lesions. IMPRESSION: 1. Complex hemorrhagic/proteinaceous septated 5.8 cm Bosniak category 2 renal cyst in the medial interpolar left kidney, which appears adherent to the left psoas muscle with asymmetric left perinephric fat stranding and curvilinear hyperenhancement along the surface of the left psoas muscle. Findings are nonspecific, but raise concern for an infected left renal cyst. Alternatively, findings could represent secondary reactive change to recent cyst rupture. 2. Chronic pancreatitis. No MRI findings of acute pancreatitis. 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 Follow-up MRI abdomen without and with IV contrast recommended in 12 months. This recommendation follows ACR consensus guidelines: Management of Incidental Pancreatic Cysts: A White Paper of the ACR Incidental Findings Committee. J Am Coll Radiol 0017;49:449-675. 3. No cholelithiasis. No biliary ductal dilatation. No choledocholithiasis. 4. Diffuse hepatic steatosis. 5.  Aortic Atherosclerosis (ICD10-I70.0). Electronically Signed   By: Ilona Sorrel M.D.   On: 09/23/2018 12:07        Scheduled Meds:  ALPRAZolam  1 mg Oral TID   amLODipine  5 mg Oral Daily   buprenorphine-naloxone  1 tablet  Sublingual TID   buPROPion  300 mg Oral q morning - 10a   gabapentin  300 mg Oral TID   heparin  5,000 Units Subcutaneous Q8H   insulin aspart  0-9 Units Subcutaneous Q4H   insulin glargine  5 Units Subcutaneous Daily   lisinopril  20 mg Oral Daily   nicotine  21 mg Transdermal Daily   sodium chloride flush  3 mL Intravenous Q12H   Continuous Infusions:  sodium chloride 75 mL/hr at 09/22/18 1847   piperacillin-tazobactam (ZOSYN)  IV 3.375 g (09/24/18 0519)     LOS: 2 days    Time spent: 28 MINUTES.     Hosie Poisson, MD [facility] Pager [phone]   If 7PM-7AM, please contact night-coverage [email or web address] Password [email or web address] 09/24/2018, 2:18 PM

## 2018-09-25 ENCOUNTER — Inpatient Hospital Stay (HOSPITAL_COMMUNITY): Payer: Medicaid Other

## 2018-09-25 LAB — GLUCOSE, CAPILLARY
Glucose-Capillary: 169 mg/dL — ABNORMAL HIGH (ref 70–99)
Glucose-Capillary: 248 mg/dL — ABNORMAL HIGH (ref 70–99)
Glucose-Capillary: 151 mg/dL — ABNORMAL HIGH (ref 70–99)
Glucose-Capillary: 203 mg/dL — ABNORMAL HIGH (ref 70–99)
Glucose-Capillary: 232 mg/dL — ABNORMAL HIGH (ref 70–99)

## 2018-09-25 LAB — BASIC METABOLIC PANEL
Anion gap: 11 (ref 5–15)
BUN: 12 mg/dL (ref 8–23)
CO2: 25 mmol/L (ref 22–32)
Calcium: 8.3 mg/dL — ABNORMAL LOW (ref 8.9–10.3)
Chloride: 100 mmol/L (ref 98–111)
Creatinine, Ser: 1.03 mg/dL (ref 0.61–1.24)
GFR calc Af Amer: >60 mL/min (ref >60)
GFR calc non Af Amer: >60 mL/min (ref >60)
Glucose, Bld: 173 mg/dL — ABNORMAL HIGH (ref 70–99)
Potassium: 3.6 mmol/L (ref 3.5–5.1)
Sodium: 136 mmol/L (ref 135–145)

## 2018-09-25 LAB — CBC
HCT: 39.6 % (ref 39.0–52.0)
Hemoglobin: 13.4 g/dL (ref 13.0–17.0)
MCH: 30.7 pg (ref 26.0–34.0)
MCHC: 33.8 g/dL (ref 30.0–36.0)
MCV: 90.8 fL (ref 80.0–100.0)
Platelets: 235 10*3/uL (ref 150–400)
RBC: 4.36 MIL/uL (ref 4.22–5.81)
RDW: 13.4 % (ref 11.5–15.5)
WBC: 9.7 10*3/uL (ref 4.0–10.5)
nRBC: 0 % (ref 0.0–0.2)

## 2018-09-25 MED ORDER — PANTOPRAZOLE SODIUM 40 MG PO TBEC: 40.0000 mg | DELAYED_RELEASE_TABLET | Freq: Every day | ORAL | Status: DC

## 2018-09-25 MED ORDER — INSULIN ASPART 100 UNIT/ML ~~LOC~~ SOLN
0.0000 [IU] | Freq: Every day | SUBCUTANEOUS | Status: DC
  Administered 2018-09-25: 2 [IU] via SUBCUTANEOUS

## 2018-09-25 MED ORDER — INSULIN ASPART 100 UNIT/ML ~~LOC~~ SOLN
0.0000 [IU] | Freq: Three times a day (TID) | SUBCUTANEOUS | Status: DC
  Administered 2018-09-26: 2 [IU] via SUBCUTANEOUS
  Administered 2018-09-26: 5 [IU] via SUBCUTANEOUS
  Administered 2018-09-26: 3 [IU] via SUBCUTANEOUS

## 2018-09-25 NOTE — Progress Notes (Signed)
PROGRESS NOTE    Douglas Melendez  ZHG:[id number] DOB: November 12, 1955 DOA: 09/21/2018 PCP: Medicine, [facility]   Brief Narrative:  Douglas Melendez is a 63 y.o. male with medical history significant for type 2 diabetes, hypertension, generalized anxiety disorder, opioid use disorder, and tobacco use who presents to the ED with 3-4 days of persistent nausea, vomiting, diarrhea, and abdominal pain.  CT abdomen/pelvis showed calcifications throughout the pancreas suspicious for chronic pancreatitis, fatty liver, sigmoid diverticulosis without diverticulitis, and a 4.8 cm low-density mass in the midpole of the left kidney.  Assessment & Plan:   Principal Problem:   Nausea and vomiting Active Problems:   Hypertension associated with diabetes ([place])   Diabetes mellitus without complication (HCC)   Generalized anxiety disorder   Opioid use disorder (HCC)   Tobacco use  Persistent nausea vomiting and diarrhea with some abdominal pain Differential includes viral gastroenteritis versus chronic pancreatitis versus cyclic vomiting syndrome and possibly  from polysubstance abuse Much improved when compared to yesterday. Patient continues to be nauseated without any vomiting. Abdominal pain about 4 to 5/10 pt reports his pain is controlled with suboxone.   He is currently on soft diet and tolerate it.      Leukocytosis, fever this am: Blood cultures pending , resume zosyn. Plan for IR guided aspiration of the infected left renal cyst.    Accelerated hypertension Much improved restarted home lisinopril and amlodipine.   Left renal mass: 4.8 cm low-density mass in the midpole of the left kidney. MRI of the abdomen and pelvis ordered, it showed to be a infected left renal cyst.  Discussed with DR Jeffie Pollock, recommended IR guided aspiration and antibiotics.  Started the patient on IV zosyn, blood cultures drawn, .  Will need to send the aspirate for culture .  Type 2 diabetes  mellitus Patient on 5 units of Lantus and continue with sliding scale insulin while inpatient.increase lantus to 8 units daily.  CBG (last 3)  Recent Labs    09/25/18 0804 09/25/18 1235 09/25/18 1631  GLUCAP 248* 151* 203*     Opiate use disorder Recommend continuing Suboxone   Generalized anxiety disorder recommend continue with bupropion and Xanax.   Hematuria with pyuria, and left sided flank pain: US renal ordered   DVT prophylaxis:  Code Status:  Full code Family Communication: none at bedside.  Disposition Plan: pending clinical improvement.    Consultants:   None  Procedures:   None  Antimicrobials:  zosyn  Subjective: No nausea or vomiting.  abd pain is about 5/10 but tolerable.  Left sided flank pain since last night.   Objective: Vitals:   09/24/18 1400 09/24/18 2341 09/25/18 0444 09/25/18 1629  BP: (!) 164/90 (!) 149/89 (!) 153/89 (!) 176/98  Pulse: 76 76 69 78  Resp: 16 16 16 16   Temp: 98.6 F (37 C) 98.8 F (37.1 C) 98.6 F (37 C) 98.3 F (36.8 C)  TempSrc: Oral Oral Oral   SpO2: 99% 96% 95% 99%  Weight:   104.8 kg   Height:       No intake or output data in the 24 hours ending 09/25/18 1847 Filed Weights   09/22/18 0016 09/22/18 0139 09/25/18 0444  Weight: 105.2 kg 105.2 kg 104.8 kg    Examination:  General exam:  Nausea improved, he is alert and comfortable.  Respiratory system: Clear to auscultation. Respiratory effort normal. Cardiovascular system: S1 & S2 heard, RRR. No JVD, murmurs, Gastrointestinal system: Abdomen is non distended,  soft, tender ness improved. Bowel sounds good.  Central nervous system: Alert and oriented. Non focal Extremities: Symmetric 5 x 5 power. Skin: No rashes, lesions or ulcers Psychiatry: mood okay.    Data Reviewed: I have personally reviewed following labs and imaging studies  CBC: Recent Labs  Lab 09/21/18 1711 09/22/18 0250 09/23/18 0247 09/25/18 0401  WBC 18.9* 19.2* 16.0* 9.7   NEUTROABS 15.9*  --   --   --   HGB 16.6 15.8 14.5 13.4  HCT 48.2 45.8 42.9 39.6  MCV 89.1 88.1 89.4 90.8  PLT 251 243 235 786   Basic Metabolic Panel: Recent Labs  Lab 09/21/18 1711 09/22/18 0250 09/23/18 0247 09/25/18 0401  NA 136 135 135 136  K 3.8 3.7 3.8 3.6  CL 100 103 104 100  CO2 20* 22 21* 25  GLUCOSE 168* 180* 151* 173*  BUN 9 12 12 12   CREATININE 0.97 0.81 0.93 1.03  CALCIUM 9.3 8.8* 8.5* 8.3*   GFR: Estimated Creatinine Clearance: 90.7 mL/min (by C-G formula based on SCr of 1.03 mg/dL). Liver Function Tests: Recent Labs  Lab 09/21/18 1711  AST 34  ALT 19  ALKPHOS 92  BILITOT 0.8  PROT 7.9  ALBUMIN 4.1   Recent Labs  Lab 09/21/18 1711  LIPASE 29   No results for input(s): AMMONIA in the last 168 hours. Coagulation Profile: No results for input(s): INR, PROTIME in the last 168 hours. Cardiac Enzymes: No results for input(s): CKTOTAL, CKMB, CKMBINDEX, TROPONINI in the last 168 hours. BNP (last 3 results) No results for input(s): PROBNP in the last 8760 hours. HbA1C: No results for input(s): HGBA1C in the last 72 hours. CBG: Recent Labs  Lab 09/24/18 2339 09/25/18 0441 09/25/18 0804 09/25/18 1235 09/25/18 1631  GLUCAP 188* 169* 248* 151* 203*   Lipid Profile: No results for input(s): CHOL, HDL, LDLCALC, TRIG, CHOLHDL, LDLDIRECT in the last 72 hours. Thyroid Function Tests: No results for input(s): TSH, T4TOTAL, FREET4, T3FREE, THYROIDAB in the last 72 hours. Anemia Panel: No results for input(s): VITAMINB12, FOLATE, FERRITIN, TIBC, IRON, RETICCTPCT in the last 72 hours. Sepsis Labs: No results for input(s): PROCALCITON, LATICACIDVEN in the last 168 hours.  Recent Results (from the past 240 hour(s))  SARS Coronavirus 2 (CEPHEID - Performed in [place] hospital lab), Hosp Order     Status: None   Collection Time: 09/21/18  5:11 PM  Result Value Ref Range Status   SARS Coronavirus 2 NEGATIVE NEGATIVE Final    Comment: (NOTE) If result  is NEGATIVE SARS-CoV-2 target nucleic acids are NOT DETECTED. The SARS-CoV-2 RNA is generally detectable in upper and lower  respiratory specimens during the acute phase of infection. The lowest  concentration of SARS-CoV-2 viral copies this assay can detect is 250  copies / mL. A negative result does not preclude SARS-CoV-2 infection  and should not be used as the sole basis for treatment or other  patient management decisions.  A negative result may occur with  improper specimen collection / handling, submission of specimen other  than nasopharyngeal swab, presence of viral mutation(s) within the  areas targeted by this assay, and inadequate number of viral copies  (<250 copies / mL). A negative result must be combined with clinical  observations, patient history, and epidemiological information. If result is POSITIVE SARS-CoV-2 target nucleic acids are DETECTED. The SARS-CoV-2 RNA is generally detectable in upper and lower  respiratory specimens dur ing the acute phase of infection.  Positive  results are indicative  of active infection with SARS-CoV-2.  Clinical  correlation with patient history and other diagnostic information is  necessary to determine patient infection status.  Positive results do  not rule out bacterial infection or co-infection with other viruses. If result is PRESUMPTIVE POSTIVE SARS-CoV-2 nucleic acids MAY BE PRESENT.   A presumptive positive result was obtained on the submitted specimen  and confirmed on repeat testing.  While 2019 novel coronavirus  (SARS-CoV-2) nucleic acids may be present in the submitted sample  additional confirmatory testing may be necessary for epidemiological  and / or clinical management purposes  to differentiate between  SARS-CoV-2 and other Sarbecovirus currently known to infect humans.  If clinically indicated additional testing with an alternate test  methodology [phone]) is advised. The SARS-CoV-2 RNA is generally   detectable in upper and lower respiratory sp ecimens during the acute  phase of infection. The expected result is Negative. Fact Sheet for Patients:  StrictlyIdeas.no Fact Sheet for Healthcare Providers: BankingDealers.co.za This test is not yet approved or cleared by the [place] FDA and has been authorized for detection and/or diagnosis of SARS-CoV-2 by FDA under an Emergency Use Authorization (EUA).  This EUA will remain in effect (meaning this test can be used) for the duration of the COVID-19 declaration under Section 564(b)(1) of the Act, 21 U.S.C. section 360bbb-3(b)(1), unless the authorization is terminated or revoked sooner. Performed at [facility], [place] [address], [place], [place] [address]   Urine culture     Status: None   Collection Time: 09/21/18  8:05 PM  Result Value Ref Range Status   Specimen Description URINE, CLEAN CATCH  Final   Special Requests NONE  Final   Culture   Final    NO GROWTH Performed at [facility], [place] [address], [place], [place] [address]    Report Status 09/22/2018 FINAL  Final  Culture, blood (Routine X 2) w Reflex to ID Panel     Status: None (Preliminary result)   Collection Time: 09/23/18  1:21 PM  Result Value Ref Range Status   Specimen Description BLOOD LEFT ANTECUBITAL  Final   Special Requests   Final    BOTTLES DRAWN AEROBIC ONLY Blood Culture adequate volume   Culture   Final    NO GROWTH 2 DAYS Performed at [facility], [place] [address] [place], [place] [address]    Report Status PENDING  Incomplete  Culture, blood (Routine X 2) w Reflex to ID Panel     Status: None (Preliminary result)   Collection Time: 09/23/18  1:34 PM  Result Value Ref Range Status   Specimen Description BLOOD LEFT HAND  Final   Special Requests   Final    BOTTLES DRAWN AEROBIC ONLY Blood Culture adequate volume   Culture   Final    NO GROWTH 2 DAYS Performed at [facility], [place] [address], [place],  [address]    Report Status PENDING  Incomplete         Radiology Studies: US Renal  Result Date: 09/25/2018 CLINICAL DATA:  Hematuria.  Urinary frequency. EXAM: RENAL / URINARY TRACT ULTRASOUND COMPLETE COMPARISON:  MRI from 09/23/2018 and CT from 09/21/2018 FINDINGS: Right Kidney: Renal measurements: 11.6 by 5.1 by 6.8 cm = volume: 210 mL . Echogenicity within normal limits. No mass or hydronephrosis visualized. Left Kidney: Renal measurements: 13.4 by 6.0 by 5.9 cm = volume: 248 mL. Echogenicity within normal limits. No hydronephrosis or stones. In the  left mid kidney a complex cystic lesion measuring 3.1 by 3.9 by 2.8 cm is identified. This corresponds to the lesion with fluid levels suggesting proteinaceous or blood products on the MRI from 2 days ago. In the left kidney lower pole, a 1.9 by 1.7 by 1.6 cm simple appearing cyst is present. Bladder: Appears normal for degree of bladder distention. Bilateral ureteral jets are visualized. IMPRESSION: 1. Complex cystic lesion in the left mid kidney measures slightly smaller than on the MRI from 2 days ago. On the prior MRI there was inflammatory stranding adjacent to this lesion and tracking in the perirenal space, but no new or enlarging perirenal fluid collection is appreciable in this vicinity on today's exam. In addition there is a small simple appearing cyst of the right kidney lower pole. 2. No stones are identified. Electronically Signed   By: Van Clines M.D.   On: 09/25/2018 17:42        Scheduled Meds: . ALPRAZolam  1 mg Oral TID  . amLODipine  5 mg Oral Daily  . buprenorphine-naloxone  1 tablet Sublingual TID  . buPROPion  300 mg Oral q morning - 10a  . gabapentin  300 mg Oral TID  . heparin  5,000 Units Subcutaneous Q8H  . insulin aspart  0-9 Units Subcutaneous Q4H  . insulin glargine  5 Units Subcutaneous Daily  . lisinopril  20 mg Oral Daily  . nicotine  21 mg Transdermal  Daily  . [START ON 09/26/2018] pantoprazole  40 mg Oral Q0600  . sodium chloride flush  3 mL Intravenous Q12H   Continuous Infusions: . sodium chloride 75 mL/hr at 09/24/18 2027  . piperacillin-tazobactam (ZOSYN)  IV 3.375 g (09/25/18 1324)     LOS: 3 days    Time spent: 28 MINUTES.     Hosie Poisson, MD [facility] Pager [phone]   If 7PM-7AM, please contact night-coverage [email or web address] Password [facility] 09/25/2018, 6:47 PM

## 2018-09-25 NOTE — Progress Notes (Signed)
At 2300 on 09/24/18, pt pulled IV out and wanted to leave AMA. Pt upset that his dinner tray has never arrived. He also felt that his earlier reported red urine has not been addressed. Day shift advised pt to save urine in his urinal, but pt discarded urine accidentally. This RN spoken to the pt at shift change, and ask him to save urine. Pt forgot.  Frozen dinner given to the pt, NP on call consulted and order for UA placed. Consequence of leaving AMA and health risks explained to pt. Pt willing to stay at the Hospital. UA results in epic, NP on call notified. Pt in no acute distress. Will continue to monitor and treat pt per MD orders.Marland Kitchen

## 2018-09-26 ENCOUNTER — Inpatient Hospital Stay (HOSPITAL_COMMUNITY): Payer: Medicaid Other

## 2018-09-26 DIAGNOSIS — R319 Hematuria, unspecified: Secondary | ICD-10-CM

## 2018-09-26 LAB — GLUCOSE, CAPILLARY
Glucose-Capillary: 238 mg/dL — ABNORMAL HIGH (ref 70–99)
Glucose-Capillary: 163 mg/dL — ABNORMAL HIGH (ref 70–99)
Glucose-Capillary: 145 mg/dL — ABNORMAL HIGH (ref 70–99)
Glucose-Capillary: 240 mg/dL — ABNORMAL HIGH (ref 70–99)

## 2018-09-26 LAB — PROTIME-INR
INR: 1.1 (ref 0.8–1.2)
Prothrombin Time: 13.8 seconds (ref 11.4–15.2)

## 2018-09-26 MED ORDER — LIDOCAINE-EPINEPHRINE 1 %-1:100000 IJ SOLN
INTRAMUSCULAR | Status: AC
  Filled 2018-09-26: qty 1

## 2018-09-26 MED ORDER — MIDAZOLAM HCL 2 MG/2ML IJ SOLN
INTRAMUSCULAR | Status: AC
  Filled 2018-09-26: qty 2

## 2018-09-26 MED ORDER — CIPROFLOXACIN HCL 500 MG PO TABS: 500.0000 mg | ORAL_TABLET | Freq: Two times a day (BID) | ORAL | 0 refills | Status: AC

## 2018-09-26 MED ORDER — HEPARIN SODIUM (PORCINE) 5000 UNIT/ML IJ SOLN: 5000.0000 [IU] | Freq: Three times a day (TID) | INTRAMUSCULAR | Status: DC

## 2018-09-26 MED ORDER — MIDAZOLAM HCL 2 MG/2ML IJ SOLN
INTRAMUSCULAR | Status: AC | PRN
  Administered 2018-09-26: 1 mg via INTRAVENOUS

## 2018-09-26 NOTE — Progress Notes (Signed)
Pharmacy Antibiotic Note  Douglas Melendez is a 63 y.o. male admitted on 09/21/2018 with possible infected renal cyst.  Pharmacy has been consulted for Zosyn dosing.  Today is abx day #4.  Pt is s/p IR drainage of fluid from renal cyst.  Will follow-up cx data.  Plan: Continue Zosyn 3.375g IV q8h (4 hour infusion).  Height: 6' (182.9 cm) Weight: 231 lb 0.7 oz (104.8 kg) IBW/kg (Calculated) : 77.6  Temp (24hrs), Avg:98.6 F (37 C), Min:98 F (36.7 C), Max:99.2 F (37.3 C)  Recent Labs  Lab 09/21/18 1711 09/22/18 0250 09/23/18 0247 09/25/18 0401  WBC 18.9* 19.2* 16.0* 9.7  CREATININE 0.97 0.81 0.93 1.03    Estimated Creatinine Clearance: 90.7 mL/min (by C-G formula based on SCr of 1.03 mg/dL).    No Known Allergies  Antimicrobials this admission: Zosyn 5/8 >>  Dose adjustments this admission:  Microbiology results: 5/11: L kidney abscess: pending 5/8: Blood cxs: NGTD 5/6: Urine culture NG final 5/6: COVID neg  Thank you for allowing pharmacy to be a part of this patient's care.  [employer], Pharm.D., BCPS Clinical Pharmacist Pager: [phone] Clinical phone for 09/26/2018 from 8:30-4:00 is [phone].  **Pharmacist phone directory can now be found on [email or web address] (PW [email or web address]).  Listed under [place].  09/26/2018 2:32 PM

## 2018-09-26 NOTE — Procedures (Signed)
Pre procedural Dx: Symptomatic left sided hemorrhagic renal cyst  Post procedural Dx: Same  Technically successful CT guided aspiration of 10 cc of bloody fluid from left sided renal cyst, followed by CT guided Bx.   EBL: None.   Complications: None immediate.   Ronny Bacon, MD Pager #: [phone]

## 2018-09-26 NOTE — Progress Notes (Signed)
Nsg Discharge Note  Admit Date:  09/21/2018 Discharge date: 09/26/2018   MATIAS THURMAN to be D/C'd Home per MD order.  AVS completed.  Copy for chart, and copy for patient signed, and dated. Patient/caregiver able to verbalize understanding.  Discharge Medication: Allergies as of 09/26/2018   No Known Allergies     Medication List    STOP taking these medications   hydrOXYzine 25 MG tablet Commonly known as:  ATARAX/VISTARIL   traZODone 50 MG tablet Commonly known as:  DESYREL     TAKE these medications   albuterol 108 (90 Base) MCG/ACT inhaler Commonly known as:  VENTOLIN HFA Inhale 2 puffs into the lungs every 6 (six) hours as needed for wheezing or shortness of breath.   ALPRAZolam 1 MG tablet Commonly known as:  XANAX Take 1 mg by mouth 3 (three) times daily.   amLODipine 10 MG tablet Commonly known as:  NORVASC Take 1 tablet (10 mg total) by mouth daily for 14 days.   buprenorphine-naloxone 8-2 mg Subl SL tablet Commonly known as:  SUBOXONE Place 1 tablet under the tongue 3 (three) times daily.   buPROPion 300 MG 24 hr tablet Commonly known as:  WELLBUTRIN XL Take 300 mg by mouth every morning.   ciprofloxacin 500 MG tablet Commonly known as:  Cipro Take 1 tablet (500 mg total) by mouth 2 (two) times daily for 10 days.   gabapentin 300 MG capsule Commonly known as:  NEURONTIN Take 300 mg by mouth 3 (three) times daily.   glucose blood test strip Commonly known as:  ACCU-CHEK ACTIVE STRIPS Use as instructed   lisinopril 40 MG tablet Commonly known as:  ZESTRIL Take 1 tablet (40 mg total) by mouth daily. What changed:    how much to take  when to take this   metFORMIN 1000 MG tablet Commonly known as:  GLUCOPHAGE Take 1,000 mg by mouth 2 (two) times daily with a meal.   ondansetron 4 MG tablet Commonly known as:  ZOFRAN Take 1 tablet (4 mg total) by mouth every 8 (eight) hours as needed for nausea or vomiting.   Tresiba 100 UNIT/ML  Soln Generic drug:  Insulin Degludec Inject 10 Units into the skin daily.   Trulicity 1.5 MC/9.4BS Sopn Generic drug:  Dulaglutide Inject 1.5 mg into the skin every 7 (seven) days.       Discharge Assessment: Vitals:   09/26/18 1030 09/26/18 1350  BP: (!) 158/86 (!) 155/86  Pulse: 77 95  Resp: 12 18  Temp:  98.8 F (37.1 C)  SpO2: 97% 96%   Skin clean, dry and intact without evidence of skin break down, no evidence of skin tears noted. IV catheter discontinued intact. Site without signs and symptoms of complications - no redness or edema noted at insertion site, patient denies c/o pain - only slight tenderness at site.  Dressing with slight pressure applied.  D/c Instructions-Education: Discharge instructions given to patient/family with verbalized understanding. D/c education completed with patient/family including follow up instructions, medication list, d/c activities limitations if indicated, with other d/c instructions as indicated by MD - patient able to verbalize understanding, all questions fully answered. Patient instructed to return to ED, call 911, or call MD for any changes in condition.  Patient escorted via [place], and D/C home via private auto.  Tresa Endo, RN 09/26/2018 7:36 PM

## 2018-09-26 NOTE — Sedation Documentation (Signed)
10 cc of frank blood aspirated from cyst

## 2018-09-26 NOTE — Plan of Care (Signed)

## 2018-09-26 NOTE — Consult Note (Signed)
Chief Complaint: Patient was seen in consultation today for left renal cyst/aspiration.  Referring Physician(s): Hosie Poisson  Supervising Physician: Sandi Mariscal  Patient Status: [facility] - In-pt  History of Present Illness: Douglas Melendez is a 63 y.o. male with a past medical history of hypertension, diabetes mellitus type II, generalized anxiety disorder, opioid use disorder currently on Suboxone, and tobacco use. He presented to [facility] ED 09/21/2018 with complaints of persistent N/V. In ED, CT abdomen/pelvis revealed a left renal cyst. He was admitted for further management.  CT abdomen/pelvis 09/21/2018: 1. Suspicious 4.8 cm low-density mass in the midpole of the left kidney. Cannot exclude renal cell neoplasm. Recommend further evaluation with MRI. 2. Changes of chronic pancreatitis. 3. Fatty infiltration of the liver. 4. Diffuse coronary artery disease.  Aortic atherosclerosis. 5. Sigmoid diverticulosis.  No active diverticulitis.  MR abdomen MRCP 09/23/2018: 1. Complex hemorrhagic/proteinaceous septated 5.8 cm Bosniak category 2 renal cyst in the medial interpolar left kidney, which appears adherent to the left psoas muscle with asymmetric left perinephric fat stranding and curvilinear hyperenhancement along the surface of the left psoas muscle. Findings are nonspecific, but raise concern for an infected left renal cyst. Alternatively, findings could represent secondary reactive change to recent cyst rupture. 2. Chronic pancreatitis. No MRI findings of acute pancreatitis. 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 Follow-up MRI abdomen without and with IV contrast recommended in 12 months. This recommendation follows ACR consensus guidelines: Management of Incidental Pancreatic Cysts: A White Paper of the ACR Incidental Findings Committee. J Am Coll Radiol 8299;37:169-678. 3. No  cholelithiasis. No biliary ductal dilatation. No choledocholithiasis. 4. Diffuse hepatic steatosis. 5.  Aortic Atherosclerosis (ICD10-I70.0).  US renal 09/25/2018: 1. Complex cystic lesion in the left mid kidney measures slightly smaller than on the MRI from 2 days ago. On the prior MRI there was inflammatory stranding adjacent to this lesion and tracking in the perirenal space, but no new or enlarging perirenal fluid collection is appreciable in this vicinity on today's exam. In addition there is a small simple appearing cyst of the right kidney lower pole. 2. No stones are identified.  IR requested by Dr. Karleen Hampshire for possible image-guided left renal cyst aspiration. Patient awake and alert laying in bed with no complaints at this time. Denies fever, chills, chest pain, dyspnea, or abdominal pain.   Past Medical History:  Diagnosis Date   Diabetes mellitus without complication ([place])    Hypertension     No past surgical history on file.  Allergies: Patient has no known allergies.  Medications: Prior to Admission medications   Medication Sig Start Date End Date Taking? Authorizing Provider  albuterol (PROVENTIL HFA;VENTOLIN HFA) 108 (90 Base) MCG/ACT inhaler Inhale 2 puffs into the lungs every 6 (six) hours as needed for wheezing or shortness of breath.   Yes [provider]  ALPRAZolam Duanne Moron) 1 MG tablet Take 1 mg by mouth 3 (three) times daily. 12/21/17  Yes [provider]  amLODipine (NORVASC) 10 MG tablet Take 1 tablet (10 mg total) by mouth daily for 14 days. 09/21/18 10/05/18 Yes Doneta Public, MD  buprenorphine-naloxone (SUBOXONE) 8-2 MG SUBL SL tablet Place 1 tablet under the tongue 3 (three) times daily.   Yes [provider]  buPROPion (WELLBUTRIN XL) 300 MG 24 hr tablet Take 300 mg by mouth every morning. 03/30/16  Yes [provider]  Dulaglutide (TRULICITY) 1.5 LF/8.1OF SOPN Inject 1.5 mg  into the skin every 7 (seven) days.   Yes [provider]  gabapentin (NEURONTIN) 300 MG capsule Take 300 mg by mouth 3 (three) times daily.   Yes [provider]  glucose blood (ACCU-CHEK ACTIVE STRIPS) test strip Use as instructed 06/16/17  Yes Hedges, Dellis Filbert, PA-C  Insulin Degludec (TRESIBA) 100 UNIT/ML SOLN Inject 10 Units into the skin daily.   Yes [provider]  lisinopril (ZESTRIL) 40 MG tablet Take 1 tablet (40 mg total) by mouth daily. 09/21/18 09/21/19 Yes Doneta Public, MD  metFORMIN (GLUCOPHAGE) 1000 MG tablet Take 1,000 mg by mouth 2 (two) times daily with a meal.  01/27/16 09/22/18 Yes [provider]  hydrOXYzine (ATARAX/VISTARIL) 25 MG tablet Take 1 tablet (25 mg total) by mouth every 6 (six) hours as needed for anxiety. Patient not taking: Reported on 09/22/2018 03/22/16   Patrecia Pour, NP  ondansetron (ZOFRAN) 4 MG tablet Take 1 tablet (4 mg total) by mouth every 8 (eight) hours as needed for nausea or vomiting. 09/21/18   Doneta Public, MD  traZODone (DESYREL) 50 MG tablet Take 1 tablet (50 mg total) by mouth at bedtime. Patient not taking: Reported on 09/22/2018 03/24/16   Horton, Barbette Hair, MD     Family History  Problem Relation Age of Onset   Diabetes Mother    Hypertension Mother    Diabetes Father    Hypertension Father     Social History   Socioeconomic History   Marital status: Divorced    Spouse name: Not on file   Number of children: Not on file   Years of education: Not on file   Highest education level: Not on file  Occupational History   Not on file  Social Needs   Financial resource strain: Not on file   Food insecurity:    Worry: Not on file    Inability: Not on file   Transportation needs:    Medical: Not on file    Non-medical: Not on file  Tobacco Use   Smoking status: Current Every Day Smoker    Packs/day: 2.00    Types: Cigarettes   Smokeless tobacco: Never Used  Substance and Sexual Activity   Alcohol use: No   Drug use: Yes    Types:  Marijuana    Comment: occasional marijuana   Sexual activity: Not Currently  Lifestyle   Physical activity:    Days per week: Not on file    Minutes per session: Not on file   Stress: Not on file  Relationships   Social connections:    Talks on phone: Not on file    Gets together: Not on file    Attends religious service: Not on file    Active member of club or organization: Not on file    Attends meetings of clubs or organizations: Not on file    Relationship status: Not on file  Other Topics Concern   Not on file  Social History Narrative   Not on file     Review of Systems: A 12 point ROS discussed and pertinent positives are indicated in the HPI above.  All other systems are negative.  Review of Systems  Constitutional: Negative for chills and fever.  Respiratory: Negative for shortness of breath and wheezing.   Cardiovascular: Negative for chest pain and palpitations.  Gastrointestinal: Negative for abdominal pain.  Psychiatric/Behavioral: Negative for behavioral problems and confusion.    Vital Signs: BP (!) 155/90 (BP Location: Left Arm)  Pulse 71    Temp 98 F (36.7 C) (Oral)    Resp 18    Ht 6' (1.829 m)    Wt 231 lb 0.7 oz (104.8 kg)    SpO2 100%    BMI 31.33 kg/m   Physical Exam Vitals signs and nursing note reviewed.  Constitutional:      General: He is not in acute distress.    Appearance: Normal appearance.  Cardiovascular:     Rate and Rhythm: Normal rate and regular rhythm.     Heart sounds: Normal heart sounds. No murmur.  Pulmonary:     Effort: Pulmonary effort is normal. No respiratory distress.     Breath sounds: Normal breath sounds. No wheezing.  Skin:    General: Skin is warm and dry.  Neurological:     Mental Status: He is alert and oriented to person, place, and time.  Psychiatric:        Mood and Affect: Mood normal.        Behavior: Behavior normal.        Thought Content: Thought content normal.        Judgment: Judgment  normal.      MD Evaluation Airway: WNL Heart: WNL Abdomen: WNL Chest/ Lungs: WNL ASA  Classification: 3 Mallampati/Airway Score: Two   Imaging: Ct Abdomen Pelvis W Contrast  Result Date: 09/21/2018 CLINICAL DATA:  Generalized abdominal pain EXAM: CT ABDOMEN AND PELVIS WITH CONTRAST TECHNIQUE: Multidetector CT imaging of the abdomen and pelvis was performed using the standard protocol following bolus administration of intravenous contrast. CONTRAST:  165mL OMNIPAQUE IOHEXOL 300 MG/ML  SOLN COMPARISON:  06/27/2006 FINDINGS: Lower chest: Coronary artery calcifications diffusely. No acute abnormality. Hepatobiliary: Diffuse low-density throughout the liver compatible with fatty infiltration. No focal abnormality. Gallbladder unremarkable. Pancreas: Calcifications throughout the pancreas compatible with chronic pancreatitis. No ductal dilatation. Spleen: No focal abnormality.  Normal size. Adrenals/Urinary Tract: Ill-defined low-density lesion within the midpole of the left kidney measures 4.8 cm. Suggestion of internal septations and hands mint. Appearance is concerning for possible renal cell neoplasm. Other smaller scattered cysts in the left kidney. Fullness of the left adrenal gland compatible hyperplasia. No hydronephrosis. Urinary bladder unremarkable. Stomach/Bowel: Sigmoid diverticulosis. No active diverticulitis. Normal appendix. Stomach and small bowel decompressed, unremarkable. Vascular/Lymphatic: Aortic atherosclerosis. No enlarged abdominal or pelvic lymph nodes. Reproductive: Mildly prominent prostate. Other: No free fluid or free air. Musculoskeletal: Degenerative changes in the lumbar spine. No acute bony abnormality. IMPRESSION: Suspicious 4.8 cm low-density mass in the midpole of the left kidney. Cannot exclude renal cell neoplasm. Recommend further evaluation with MRI. Changes of chronic pancreatitis. Fatty infiltration of the liver. Diffuse coronary artery disease.  Aortic  atherosclerosis. Sigmoid diverticulosis.  No active diverticulitis. Electronically Signed   By: Rolm Baptise M.D.   On: 09/21/2018 19:24   US Renal  Result Date: 09/25/2018 CLINICAL DATA:  Hematuria.  Urinary frequency. EXAM: RENAL / URINARY TRACT ULTRASOUND COMPLETE COMPARISON:  MRI from 09/23/2018 and CT from 09/21/2018 FINDINGS: Right Kidney: Renal measurements: 11.6 by 5.1 by 6.8 cm = volume: 210 mL . Echogenicity within normal limits. No mass or hydronephrosis visualized. Left Kidney: Renal measurements: 13.4 by 6.0 by 5.9 cm = volume: 248 mL. Echogenicity within normal limits. No hydronephrosis or stones. In the left mid kidney a complex cystic lesion measuring 3.1 by 3.9 by 2.8 cm is identified. This corresponds to the lesion with fluid levels suggesting proteinaceous or blood products on the MRI from 2 days ago. In the  left kidney lower pole, a 1.9 by 1.7 by 1.6 cm simple appearing cyst is present. Bladder: Appears normal for degree of bladder distention. Bilateral ureteral jets are visualized. IMPRESSION: 1. Complex cystic lesion in the left mid kidney measures slightly smaller than on the MRI from 2 days ago. On the prior MRI there was inflammatory stranding adjacent to this lesion and tracking in the perirenal space, but no new or enlarging perirenal fluid collection is appreciable in this vicinity on today's exam. In addition there is a small simple appearing cyst of the right kidney lower pole. 2. No stones are identified. Electronically Signed   By: Van Clines M.D.   On: 09/25/2018 17:42   Mr 3d Recon At Scanner  Result Date: 09/23/2018 CLINICAL DATA:  Inpatient. Indeterminate left renal mass and chronic pancreatitis on recent CT abdomen study. EXAM: MRI ABDOMEN WITHOUT AND WITH CONTRAST (INCLUDING MRCP) TECHNIQUE: Multiplanar multisequence MR imaging of the abdomen was performed both before and after the administration of intravenous contrast. Heavily T2-weighted images of the biliary  and pancreatic ducts were obtained, and three-dimensional MRCP images were rendered by post processing. CONTRAST:  10 cc Gadavist IV. COMPARISON:  09/21/2018 CT abdomen/pelvis. FINDINGS: Lower chest: Minimal scarring versus atelectasis at the dependent left lung base. Hepatobiliary: Normal liver size and configuration. Moderate diffuse hepatic steatosis. Two scattered subcentimeter simple liver cysts. No suspicious liver masses. Normal gallbladder with no cholelithiasis. No biliary ductal dilatation. Common bile duct diameter 6 mm. No evidence of choledocholithiasis. No biliary strictures or masses. Pancreas: There is mild diffuse pancreatic duct dilation (3 mm diameter) with a slightly beaded appearance in the pancreatic head. Pancreatic parenchyma is mildly decreased in T1 signal intensity. Findings are compatible with chronic pancreatitis. There are numerous cystic lesions scattered throughout the pancreas, most numerous in the pancreatic head, largest 1.4 x 1.3 x 1.4 cm in the uncinate process (series 12/image 23), none of which demonstrate wall thickening, solid enhancement or mural nodularity. No pancreas divisum. No peripancreatic edema or fluid collections. Spleen: Normal size spleen. 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 No additional splenic lesions. Adrenals/Urinary Tract: Diffuse thickening of both adrenal glands, left greater than right, without discrete adrenal nodules, suggesting adrenal hyperplasia. No hydronephrosis. There is a complex 5.8 x 3.8 cm medial interpolar left renal cyst (series 6/image 27), which demonstrates layering T1 hyperintense hemorrhagic/proteinaceous material and a thin internal septation, with no measurable enhancement or mural nodularity, compatible with a Bosniak category 2 hemorrhagic/proteinaceous renal cyst, which measured 3.8 x 2.4 cm on 06/27/2006 CT, increased in size. This complex interpolar left renal cyst appears  adherent to the left psoas muscle with curvilinear asymmetric hyperenhancement along the surface of the left psoas muscle with associated surrounding asymmetry left perinephric fat stranding. No perinephric collections. Small simple renal cysts in both kidneys, largest 1.6 cm in the lower left kidney. No suspicious renal masses. Stomach/Bowel: Normal non-distended stomach. Visualized small and large bowel is normal caliber, with no bowel wall thickening. Vascular/Lymphatic: Atherosclerotic nonaneurysmal abdominal aorta. Patent portal, splenic, hepatic and renal veins. No pathologically enlarged lymph nodes in the abdomen. Other: No abdominal ascites or focal fluid collection. Musculoskeletal: No aggressive appearing focal osseous lesions. IMPRESSION: 1. Complex hemorrhagic/proteinaceous septated 5.8 cm Bosniak category 2 renal cyst in the medial interpolar left kidney, which appears adherent to the left psoas muscle with asymmetric left perinephric fat stranding and curvilinear hyperenhancement along the surface of the left psoas muscle. Findings are nonspecific, but raise concern  for an infected left renal cyst. Alternatively, findings could represent secondary reactive change to recent cyst rupture. 2. Chronic pancreatitis. No MRI findings of acute pancreatitis. 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 Follow-up MRI abdomen without and with IV contrast recommended in 12 months. This recommendation follows ACR consensus guidelines: Management of Incidental Pancreatic Cysts: A White Paper of the ACR Incidental Findings Committee. J Am Coll Radiol 7035;00:938-182. 3. No cholelithiasis. No biliary ductal dilatation. No choledocholithiasis. 4. Diffuse hepatic steatosis. 5.  Aortic Atherosclerosis (ICD10-I70.0). Electronically Signed   By: Ilona Sorrel M.D.   On: 09/23/2018 12:07   Dg Chest Portable 1  View  Result Date: 09/21/2018 CLINICAL DATA:  Cough EXAM: PORTABLE CHEST 1 VIEW COMPARISON:  03/01/2010 FINDINGS: Mild cardiomegaly with aortic atherosclerosis. Slight central vascular congestion. No pleural effusion. No pneumothorax. IMPRESSION: Cardiomegaly with slight central congestion. Electronically Signed   By: Donavan Foil M.D.   On: 09/21/2018 18:03   Mr Abdomen Mrcp Moise Boring Contast  Result Date: 09/23/2018 CLINICAL DATA:  Inpatient. Indeterminate left renal mass and chronic pancreatitis on recent CT abdomen study. EXAM: MRI ABDOMEN WITHOUT AND WITH CONTRAST (INCLUDING MRCP) TECHNIQUE: Multiplanar multisequence MR imaging of the abdomen was performed both before and after the administration of intravenous contrast. Heavily T2-weighted images of the biliary and pancreatic ducts were obtained, and three-dimensional MRCP images were rendered by post processing. CONTRAST:  10 cc Gadavist IV. COMPARISON:  09/21/2018 CT abdomen/pelvis. FINDINGS: Lower chest: Minimal scarring versus atelectasis at the dependent left lung base. Hepatobiliary: Normal liver size and configuration. Moderate diffuse hepatic steatosis. Two scattered subcentimeter simple liver cysts. No suspicious liver masses. Normal gallbladder with no cholelithiasis. No biliary ductal dilatation. Common bile duct diameter 6 mm. No evidence of choledocholithiasis. No biliary strictures or masses. Pancreas: There is mild diffuse pancreatic duct dilation (3 mm diameter) with a slightly beaded appearance in the pancreatic head. Pancreatic parenchyma is mildly decreased in T1 signal intensity. Findings are compatible with chronic pancreatitis. There are numerous cystic lesions scattered throughout the pancreas, most numerous in the pancreatic head, largest 1.4 x 1.3 x 1.4 cm in the uncinate process (series 12/image 23), none of which demonstrate wall thickening, solid enhancement or mural nodularity. No pancreas divisum. No peripancreatic edema or fluid  collections. Spleen: Normal size spleen. 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 No additional splenic lesions. Adrenals/Urinary Tract: Diffuse thickening of both adrenal glands, left greater than right, without discrete adrenal nodules, suggesting adrenal hyperplasia. No hydronephrosis. There is a complex 5.8 x 3.8 cm medial interpolar left renal cyst (series 6/image 27), which demonstrates layering T1 hyperintense hemorrhagic/proteinaceous material and a thin internal septation, with no measurable enhancement or mural nodularity, compatible with a Bosniak category 2 hemorrhagic/proteinaceous renal cyst, which measured 3.8 x 2.4 cm on 06/27/2006 CT, increased in size. This complex interpolar left renal cyst appears adherent to the left psoas muscle with curvilinear asymmetric hyperenhancement along the surface of the left psoas muscle with associated surrounding asymmetry left perinephric fat stranding. No perinephric collections. Small simple renal cysts in both kidneys, largest 1.6 cm in the lower left kidney. No suspicious renal masses. Stomach/Bowel: Normal non-distended stomach. Visualized small and large bowel is normal caliber, with no bowel wall thickening. Vascular/Lymphatic: Atherosclerotic nonaneurysmal abdominal aorta. Patent portal, splenic, hepatic and renal veins. No pathologically enlarged lymph nodes in the abdomen. Other: No abdominal ascites or focal fluid  collection. Musculoskeletal: No aggressive appearing focal osseous lesions. IMPRESSION: 1. Complex hemorrhagic/proteinaceous septated 5.8 cm Bosniak category 2 renal cyst in the medial interpolar left kidney, which appears adherent to the left psoas muscle with asymmetric left perinephric fat stranding and curvilinear hyperenhancement along the surface of the left psoas muscle. Findings are nonspecific, but raise concern for an infected left renal cyst. Alternatively, findings could represent  secondary reactive change to recent cyst rupture. 2. Chronic pancreatitis. No MRI findings of acute pancreatitis. 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 Follow-up MRI abdomen without and with IV contrast recommended in 12 months. This recommendation follows ACR consensus guidelines: Management of Incidental Pancreatic Cysts: A White Paper of the ACR Incidental Findings Committee. J Am Coll Radiol 7902;40:973-532. 3. No cholelithiasis. No biliary ductal dilatation. No choledocholithiasis. 4. Diffuse hepatic steatosis. 5.  Aortic Atherosclerosis (ICD10-I70.0). Electronically Signed   By: Ilona Sorrel M.D.   On: 09/23/2018 12:07    Labs:  CBC: Recent Labs    09/21/18 1711 09/22/18 0250 09/23/18 0247 09/25/18 0401  WBC 18.9* 19.2* 16.0* 9.7  HGB 16.6 15.8 14.5 13.4  HCT 48.2 45.8 42.9 39.6  PLT 251 243 235 235    COAGS: Recent Labs    09/26/18 0817  INR 1.1    BMP: Recent Labs    09/21/18 1711 09/22/18 0250 09/23/18 0247 09/25/18 0401  NA 136 135 135 136  K 3.8 3.7 3.8 3.6  CL 100 103 104 100  CO2 20* 22 21* 25  GLUCOSE 168* 180* 151* 173*  BUN 9 12 12 12   CALCIUM 9.3 8.8* 8.5* 8.3*  CREATININE 0.97 0.81 0.93 1.03  GFRNONAA >60 >60 >60 >60  GFRAA >60 >60 >60 >60    LIVER FUNCTION TESTS: Recent Labs    09/21/18 1711  BILITOT 0.8  AST 34  ALT 19  ALKPHOS 92  PROT 7.9  ALBUMIN 4.1     Assessment and Plan:  Left renal cyst. Plan for image-guided left renal cyst aspiration/biopsy/drain placement today with Dr. Pascal Lux. Patient is NPO. Afebrile and WBCs WNL. He does not take blood thinners. INR 1.1 seconds today.  Risks and benefits discussed with the patient including, but not limited to bleeding, infection, damage to adjacent structures or low yield requiring additional tests. All of the patient's questions were answered, patient is agreeable to  proceed. Consent signed and in chart.   Thank you for this interesting consult.  I greatly enjoyed meeting ISAK SOTOMAYOR and look forward to participating in their care.  A copy of this report was sent to the requesting provider on this date.  Electronically Signed: Earley Abide, PA-C 09/26/2018, 9:41 AM   I spent a total of 40 Minutes in face to face in clinical consultation, greater than 50% of which was counseling/coordinating care for left renal cyst/aspiration.

## 2018-09-27 ENCOUNTER — Emergency Department (HOSPITAL_COMMUNITY)
Admission: EM | Admit: 2018-09-27 | Discharge: 2018-09-28 | Disposition: A | Discharge status: Home / Self Care | Payer: Medicaid Other | Attending: Emergency Medicine | Admitting: Emergency Medicine

## 2018-09-27 ENCOUNTER — Encounter (HOSPITAL_COMMUNITY): Payer: Self-pay

## 2018-09-27 ENCOUNTER — Other Ambulatory Visit: Payer: Self-pay

## 2018-09-27 DIAGNOSIS — F1290 Cannabis use, unspecified, uncomplicated: Secondary | ICD-10-CM | POA: Insufficient documentation

## 2018-09-27 DIAGNOSIS — T07XXXA Unspecified multiple injuries, initial encounter: Secondary | ICD-10-CM | POA: Diagnosis present

## 2018-09-27 DIAGNOSIS — Y929 Unspecified place or not applicable: Secondary | ICD-10-CM | POA: Insufficient documentation

## 2018-09-27 DIAGNOSIS — S0093XA Contusion of unspecified part of head, initial encounter: Secondary | ICD-10-CM | POA: Diagnosis not present

## 2018-09-27 DIAGNOSIS — Z7984 Long term (current) use of oral hypoglycemic drugs: Secondary | ICD-10-CM | POA: Diagnosis not present

## 2018-09-27 DIAGNOSIS — M79622 Pain in left upper arm: Secondary | ICD-10-CM | POA: Diagnosis not present

## 2018-09-27 DIAGNOSIS — Z79899 Other long term (current) drug therapy: Secondary | ICD-10-CM | POA: Diagnosis not present

## 2018-09-27 DIAGNOSIS — E119 Type 2 diabetes mellitus without complications: Secondary | ICD-10-CM | POA: Diagnosis not present

## 2018-09-27 DIAGNOSIS — Y999 Unspecified external cause status: Secondary | ICD-10-CM | POA: Diagnosis not present

## 2018-09-27 DIAGNOSIS — R51 Headache: Secondary | ICD-10-CM | POA: Diagnosis not present

## 2018-09-27 DIAGNOSIS — R109 Unspecified abdominal pain: Secondary | ICD-10-CM | POA: Insufficient documentation

## 2018-09-27 DIAGNOSIS — R0789 Other chest pain: Secondary | ICD-10-CM | POA: Insufficient documentation

## 2018-09-27 DIAGNOSIS — F17210 Nicotine dependence, cigarettes, uncomplicated: Secondary | ICD-10-CM | POA: Insufficient documentation

## 2018-09-27 DIAGNOSIS — Y939 Activity, unspecified: Secondary | ICD-10-CM | POA: Diagnosis not present

## 2018-09-27 DIAGNOSIS — I10 Essential (primary) hypertension: Secondary | ICD-10-CM | POA: Insufficient documentation
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
## 2018-09-27 NOTE — Discharge Summary (Signed)
Physician Discharge Summary  JEDRICK HUTCHERSON JJO:[id number] DOB: [birth year] DOA: 09/21/2018  PCP: Medicine, [place] date: 09/21/2018 Discharge date: 09/26/2018  Admitted From: Home Disposition:  HOme.   Recommendations for Outpatient Follow-up:  1. Follow up with PCP in 1-2 weeks 2. Please obtain BMP/CBC in one week Please follow up with Dr Jeffie Pollock Urology in 1 to 2 weeks.   Discharge Condition:stable.  CODE STATUS: full code.  Diet recommendation: Heart Healthy  Brief/Interim Summary: Douglas Wandler McBrideis a 63 y.o.malewith medical history significant fortype 2 diabetes, hypertension, generalized anxiety disorder, opioid use disorder, and tobacco use whopresents to the ED with 3-4 days of persistent nausea, vomiting, diarrhea, and abdominal pain. CT abdomen/pelvis showed calcifications throughout the pancreas suspicious for chronic pancreatitis, fatty liver, sigmoid diverticulosis without diverticulitis, and a 4.8 cm low-density mass in the midpole of the left kidney.   Discharge Diagnoses:  Principal Problem:   Nausea and vomiting Active Problems:   Hypertension associated with diabetes ([place])   Diabetes mellitus without complication (HCC)   Generalized anxiety disorder   Opioid use disorder (HCC)   Tobacco use  Persistent nausea vomiting and diarrhea with some abdominal pain Differential includes viral gastroenteritis versus chronic pancreatitis versus cyclic vomiting syndrome and possibly  from polysubstance abuse Resolved.  He is currently on soft diet and tolerate it.      Leukocytosis, fever this am: Blood cultures done and negative. Possibly from the infected renal cyst.  IR guided aspiration of the infected left renal cyst done. Bloody fluid aspirated. Sent for cultures.  He was also recommended to follow up with Urology as outpatient.    Accelerated hypertension Much improved restarted home lisinopril and amlodipine.   Left  renal mass: 4.8 cm low-density mass in the midpole of the left kidney. MRI of the abdomen and pelvis ordered, it showed to be a infected left renal cyst.  Discussed with DR Jeffie Pollock, recommended IR guided aspiration and antibiotics.  Started the patient on IV zosyn, blood cultures drawn, .  Will need to send the aspirate for culture .  Type 2 diabetes mellitus Resume home medications.     Opiate use disorder Recommend continuing Suboxone   Generalized anxiety disorder recommend continue with bupropion and Xanax.   Hematuria with pyuria, and left sided flank pain: US renal ordered showed decrease in the left renal cyst.     Discharge Instructions  Discharge Instructions    Diet - low sodium heart healthy   Complete by:  As directed    Discharge instructions   Complete by:  As directed    Please follow up with Urology in 1 week for the infected renal cyst.  Please follow up with PCP , to check on the culture of the renal cyst aspirate.     Allergies as of 09/26/2018   No Known Allergies     Medication List    STOP taking these medications   hydrOXYzine 25 MG tablet Commonly known as:  ATARAX/VISTARIL   traZODone 50 MG tablet Commonly known as:  DESYREL     TAKE these medications   albuterol 108 (90 Base) MCG/ACT inhaler Commonly known as:  VENTOLIN HFA Inhale 2 puffs into the lungs every 6 (six) hours as needed for wheezing or shortness of breath.   ALPRAZolam 1 MG tablet Commonly known as:  XANAX Take 1 mg by mouth 3 (three) times daily.   amLODipine 10 MG tablet Commonly known as:  NORVASC Take 1 tablet (10 mg  total) by mouth daily for 14 days.   buprenorphine-naloxone 8-2 mg Subl SL tablet Commonly known as:  SUBOXONE Place 1 tablet under the tongue 3 (three) times daily.   buPROPion 300 MG 24 hr tablet Commonly known as:  WELLBUTRIN XL Take 300 mg by mouth every morning.   ciprofloxacin 500 MG tablet Commonly known as:  Cipro Take 1 tablet  (500 mg total) by mouth 2 (two) times daily for 10 days.   gabapentin 300 MG capsule Commonly known as:  NEURONTIN Take 300 mg by mouth 3 (three) times daily.   glucose blood test strip Commonly known as:  ACCU-CHEK ACTIVE STRIPS Use as instructed   lisinopril 40 MG tablet Commonly known as:  ZESTRIL Take 1 tablet (40 mg total) by mouth daily. What changed:    how much to take  when to take this   metFORMIN 1000 MG tablet Commonly known as:  GLUCOPHAGE Take 1,000 mg by mouth 2 (two) times daily with a meal.   ondansetron 4 MG tablet Commonly known as:  ZOFRAN Take 1 tablet (4 mg total) by mouth every 8 (eight) hours as needed for nausea or vomiting.   Tresiba 100 UNIT/ML Soln Generic drug:  Insulin Degludec Inject 10 Units into the skin daily.   Trulicity 1.5 EQ/6.8TM Sopn Generic drug:  Dulaglutide Inject 1.5 mg into the skin every 7 (seven) days.      Follow-up Information    Medicine, [facility]. Schedule an appointment as soon as possible for a visit in 2 days.   Specialty:  Family Medicine       Douglas Seal, MD. Schedule an appointment as soon as possible for a visit in 1 week(s).   Specialty:  Urology Why:  for infected renal cyst  Contact information: [place] [address] [phone]          No Known Allergies  Consultations:  Dr Jeffie Pollock over the phone  IR    Procedures/Studies: Ct Abdomen Pelvis W Contrast  Result Date: 09/21/2018 CLINICAL DATA:  Generalized abdominal pain EXAM: CT ABDOMEN AND PELVIS WITH CONTRAST TECHNIQUE: Multidetector CT imaging of the abdomen and pelvis was performed using the standard protocol following bolus administration of intravenous contrast. CONTRAST:  154mL OMNIPAQUE IOHEXOL 300 MG/ML  SOLN COMPARISON:  06/27/2006 FINDINGS: Lower chest: Coronary artery calcifications diffusely. No acute abnormality. Hepatobiliary: Diffuse low-density throughout the liver compatible with fatty  infiltration. No focal abnormality. Gallbladder unremarkable. Pancreas: Calcifications throughout the pancreas compatible with chronic pancreatitis. No ductal dilatation. Spleen: No focal abnormality.  Normal size. Adrenals/Urinary Tract: Ill-defined low-density lesion within the midpole of the left kidney measures 4.8 cm. Suggestion of internal septations and hands mint. Appearance is concerning for possible renal cell neoplasm. Other smaller scattered cysts in the left kidney. Fullness of the left adrenal gland compatible hyperplasia. No hydronephrosis. Urinary bladder unremarkable. Stomach/Bowel: Sigmoid diverticulosis. No active diverticulitis. Normal appendix. Stomach and small bowel decompressed, unremarkable. Vascular/Lymphatic: Aortic atherosclerosis. No enlarged abdominal or pelvic lymph nodes. Reproductive: Mildly prominent prostate. Other: No free fluid or free air. Musculoskeletal: Degenerative changes in the lumbar spine. No acute bony abnormality. IMPRESSION: Suspicious 4.8 cm low-density mass in the midpole of the left kidney. Cannot exclude renal cell neoplasm. Recommend further evaluation with MRI. Changes of chronic pancreatitis. Fatty infiltration of the liver. Diffuse coronary artery disease.  Aortic atherosclerosis. Sigmoid diverticulosis.  No active diverticulitis. Electronically Signed   By: Rolm Baptise M.D.   On: 09/21/2018 19:24  US Renal  Result Date: 09/25/2018 CLINICAL DATA:  Hematuria.  Urinary frequency. EXAM: RENAL / URINARY TRACT ULTRASOUND COMPLETE COMPARISON:  MRI from 09/23/2018 and CT from 09/21/2018 FINDINGS: Right Kidney: Renal measurements: 11.6 by 5.1 by 6.8 cm = volume: 210 mL . Echogenicity within normal limits. No mass or hydronephrosis visualized. Left Kidney: Renal measurements: 13.4 by 6.0 by 5.9 cm = volume: 248 mL. Echogenicity within normal limits. No hydronephrosis or stones. In the left mid kidney a complex cystic lesion measuring 3.1 by 3.9 by 2.8 cm is  identified. This corresponds to the lesion with fluid levels suggesting proteinaceous or blood products on the MRI from 2 days ago. In the left kidney lower pole, a 1.9 by 1.7 by 1.6 cm simple appearing cyst is present. Bladder: Appears normal for degree of bladder distention. Bilateral ureteral jets are visualized. IMPRESSION: 1. Complex cystic lesion in the left mid kidney measures slightly smaller than on the MRI from 2 days ago. On the prior MRI there was inflammatory stranding adjacent to this lesion and tracking in the perirenal space, but no new or enlarging perirenal fluid collection is appreciable in this vicinity on today's exam. In addition there is a small simple appearing cyst of the right kidney lower pole. 2. No stones are identified. Electronically Signed   By: Van Clines M.D.   On: 09/25/2018 17:42   Ct Aspiration  Result Date: 09/26/2018 INDICATION: Symptomatic left-sided hemorrhagic renal cyst. Please perform CT-guided biopsy for tissue diagnostic purposes and exclude the presence of pre-existing infection. EXAM: LEFT-SIDED RENAL CYST ASPIRATION AND BIOPSY COMPARISON:  CT abdomen pelvis-09/21/2018; abdominal MRI-09/23/2018; renal ultrasound-09/25/2018 MEDICATIONS: None. ANESTHESIA/SEDATION: Versed 1 mg IV Sedation time: 14 minutes; The patient was continuously monitored during the procedure by the interventional radiology nurse under my direct supervision. CONTRAST:  None. COMPLICATIONS: None immediate. PROCEDURE: Informed consent was obtained from the patient following an explanation of the procedure, risks, benefits and alternatives. A time out was performed prior to the initiation of the procedure. The patient was positioned supine on the CT table and a limited CT was performed for procedural planning demonstrating slight reduction in size of bilobed complex cystic lesion involving the posterior interpolar aspect the left kidney measuring approximately 3.8 x 3.1 cm (image 49, series  2), previously, 5.7 x 2.9 cm on abdominal CT performed 09/21/2018. The procedure was planned. The operative site was prepped and draped in the usual sterile fashion. Appropriate trajectory was confirmed with a 22 gauge spinal needle after the adjacent tissues were anesthetized with 1% Lidocaine with epinephrine. Under intermittent CT guidance, a 17 gauge coaxial needle was advanced into the peripheral aspect of the mass. Appropriate positioning was confirmed and approximately 10 cc of bloody fluid was aspirated from the cyst. Post aspiration CT images obtained, followed by the acquisition of 4 core needle biopsies with an 18 gauge core needle biopsy device. The co-axial needle was removed and superficial hemostasis was achieved with manual compression. A limited postprocedural CT was negative for hemorrhage or additional complication. A dressing was placed. The patient tolerated the procedure well without immediate postprocedural complication. IMPRESSION: Technically successful CT guided aspiration of approximately 10 cc of bloody fluid from left-sided hemorrhagic renal cyst followed by needle biopsy of the residual capsule. Note, aspirated fluid was sent for both cytologic and Gram stain analysis. Electronically Signed   By: Sandi Mariscal M.D.   On: 09/26/2018 11:33   Mr 3d Recon At Scanner  Result Date: 09/23/2018 CLINICAL DATA:  Inpatient. Indeterminate  left renal mass and chronic pancreatitis on recent CT abdomen study. EXAM: MRI ABDOMEN WITHOUT AND WITH CONTRAST (INCLUDING MRCP) TECHNIQUE: Multiplanar multisequence MR imaging of the abdomen was performed both before and after the administration of intravenous contrast. Heavily T2-weighted images of the biliary and pancreatic ducts were obtained, and three-dimensional MRCP images were rendered by post processing. CONTRAST:  10 cc Gadavist IV. COMPARISON:  09/21/2018 CT abdomen/pelvis. FINDINGS: Lower chest: Minimal scarring versus atelectasis at the dependent  left lung base. Hepatobiliary: Normal liver size and configuration. Moderate diffuse hepatic steatosis. Two scattered subcentimeter simple liver cysts. No suspicious liver masses. Normal gallbladder with no cholelithiasis. No biliary ductal dilatation. Common bile duct diameter 6 mm. No evidence of choledocholithiasis. No biliary strictures or masses. Pancreas: There is mild diffuse pancreatic duct dilation (3 mm diameter) with a slightly beaded appearance in the pancreatic head. Pancreatic parenchyma is mildly decreased in T1 signal intensity. Findings are compatible with chronic pancreatitis. There are numerous cystic lesions scattered throughout the pancreas, most numerous in the pancreatic head, largest 1.4 x 1.3 x 1.4 cm in the uncinate process (series 12/image 23), none of which demonstrate wall thickening, solid enhancement or mural nodularity. No pancreas divisum. No peripancreatic edema or fluid collections. Spleen: Normal size spleen. 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 No additional splenic lesions. Adrenals/Urinary Tract: Diffuse thickening of both adrenal glands, left greater than right, without discrete adrenal nodules, suggesting adrenal hyperplasia. No hydronephrosis. There is a complex 5.8 x 3.8 cm medial interpolar left renal cyst (series 6/image 27), which demonstrates layering T1 hyperintense hemorrhagic/proteinaceous material and a thin internal septation, with no measurable enhancement or mural nodularity, compatible with a Bosniak category 2 hemorrhagic/proteinaceous renal cyst, which measured 3.8 x 2.4 cm on 06/27/2006 CT, increased in size. This complex interpolar left renal cyst appears adherent to the left psoas muscle with curvilinear asymmetric hyperenhancement along the surface of the left psoas muscle with associated surrounding asymmetry left perinephric fat stranding. No perinephric collections. Small simple renal cysts in both  kidneys, largest 1.6 cm in the lower left kidney. No suspicious renal masses. Stomach/Bowel: Normal non-distended stomach. Visualized small and large bowel is normal caliber, with no bowel wall thickening. Vascular/Lymphatic: Atherosclerotic nonaneurysmal abdominal aorta. Patent portal, splenic, hepatic and renal veins. No pathologically enlarged lymph nodes in the abdomen. Other: No abdominal ascites or focal fluid collection. Musculoskeletal: No aggressive appearing focal osseous lesions. IMPRESSION: 1. Complex hemorrhagic/proteinaceous septated 5.8 cm Bosniak category 2 renal cyst in the medial interpolar left kidney, which appears adherent to the left psoas muscle with asymmetric left perinephric fat stranding and curvilinear hyperenhancement along the surface of the left psoas muscle. Findings are nonspecific, but raise concern for an infected left renal cyst. Alternatively, findings could represent secondary reactive change to recent cyst rupture. 2. Chronic pancreatitis. No MRI findings of acute pancreatitis. 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 Follow-up MRI abdomen without and with IV contrast recommended in 12 months. This recommendation follows ACR consensus guidelines: Management of Incidental Pancreatic Cysts: A White Paper of the ACR Incidental Findings Committee. J Am Coll Radiol 2979;89:211-941. 3. No cholelithiasis. No biliary ductal dilatation. No choledocholithiasis. 4. Diffuse hepatic steatosis. 5.  Aortic Atherosclerosis (ICD10-I70.0). Electronically Signed   By: Ilona Sorrel M.D.   On: 09/23/2018 12:07   Ct Biopsy  Result Date: 09/26/2018 INDICATION: Symptomatic left-sided hemorrhagic renal cyst. Please perform CT-guided biopsy  for tissue diagnostic purposes and exclude the presence of pre-existing infection. EXAM: LEFT-SIDED RENAL CYST ASPIRATION AND BIOPSY COMPARISON:  CT  abdomen pelvis-09/21/2018; abdominal MRI-09/23/2018; renal ultrasound-09/25/2018 MEDICATIONS: None. ANESTHESIA/SEDATION: Versed 1 mg IV Sedation time: 14 minutes; The patient was continuously monitored during the procedure by the interventional radiology nurse under my direct supervision. CONTRAST:  None. COMPLICATIONS: None immediate. PROCEDURE: Informed consent was obtained from the patient following an explanation of the procedure, risks, benefits and alternatives. A time out was performed prior to the initiation of the procedure. The patient was positioned supine on the CT table and a limited CT was performed for procedural planning demonstrating slight reduction in size of bilobed complex cystic lesion involving the posterior interpolar aspect the left kidney measuring approximately 3.8 x 3.1 cm (image 49, series 2), previously, 5.7 x 2.9 cm on abdominal CT performed 09/21/2018. The procedure was planned. The operative site was prepped and draped in the usual sterile fashion. Appropriate trajectory was confirmed with a 22 gauge spinal needle after the adjacent tissues were anesthetized with 1% Lidocaine with epinephrine. Under intermittent CT guidance, a 17 gauge coaxial needle was advanced into the peripheral aspect of the mass. Appropriate positioning was confirmed and approximately 10 cc of bloody fluid was aspirated from the cyst. Post aspiration CT images obtained, followed by the acquisition of 4 core needle biopsies with an 18 gauge core needle biopsy device. The co-axial needle was removed and superficial hemostasis was achieved with manual compression. A limited postprocedural CT was negative for hemorrhage or additional complication. A dressing was placed. The patient tolerated the procedure well without immediate postprocedural complication. IMPRESSION: Technically successful CT guided aspiration of approximately 10 cc of bloody fluid from left-sided hemorrhagic renal cyst followed by needle biopsy  of the residual capsule. Note, aspirated fluid was sent for both cytologic and Gram stain analysis. Electronically Signed   By: Sandi Mariscal M.D.   On: 09/26/2018 11:33   Dg Chest Portable 1 View  Result Date: 09/21/2018 CLINICAL DATA:  Cough EXAM: PORTABLE CHEST 1 VIEW COMPARISON:  03/01/2010 FINDINGS: Mild cardiomegaly with aortic atherosclerosis. Slight central vascular congestion. No pleural effusion. No pneumothorax. IMPRESSION: Cardiomegaly with slight central congestion. Electronically Signed   By: Donavan Foil M.D.   On: 09/21/2018 18:03   Mr Abdomen Mrcp Moise Boring Contast  Result Date: 09/23/2018 CLINICAL DATA:  Inpatient. Indeterminate left renal mass and chronic pancreatitis on recent CT abdomen study. EXAM: MRI ABDOMEN WITHOUT AND WITH CONTRAST (INCLUDING MRCP) TECHNIQUE: Multiplanar multisequence MR imaging of the abdomen was performed both before and after the administration of intravenous contrast. Heavily T2-weighted images of the biliary and pancreatic ducts were obtained, and three-dimensional MRCP images were rendered by post processing. CONTRAST:  10 cc Gadavist IV. COMPARISON:  09/21/2018 CT abdomen/pelvis. FINDINGS: Lower chest: Minimal scarring versus atelectasis at the dependent left lung base. Hepatobiliary: Normal liver size and configuration. Moderate diffuse hepatic steatosis. Two scattered subcentimeter simple liver cysts. No suspicious liver masses. Normal gallbladder with no cholelithiasis. No biliary ductal dilatation. Common bile duct diameter 6 mm. No evidence of choledocholithiasis. No biliary strictures or masses. Pancreas: There is mild diffuse pancreatic duct dilation (3 mm diameter) with a slightly beaded appearance in the pancreatic head. Pancreatic parenchyma is mildly decreased in T1 signal intensity. Findings are compatible with chronic pancreatitis. There are numerous cystic lesions scattered throughout the pancreas, most numerous in the pancreatic head, largest 1.4 x 1.3  x 1.4 cm in the uncinate process (series 12/image 23), none  of which demonstrate wall thickening, solid enhancement or mural nodularity. No pancreas divisum. No peripancreatic edema or fluid collections. Spleen: Normal size spleen. 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 No additional splenic lesions. Adrenals/Urinary Tract: Diffuse thickening of both adrenal glands, left greater than right, without discrete adrenal nodules, suggesting adrenal hyperplasia. No hydronephrosis. There is a complex 5.8 x 3.8 cm medial interpolar left renal cyst (series 6/image 27), which demonstrates layering T1 hyperintense hemorrhagic/proteinaceous material and a thin internal septation, with no measurable enhancement or mural nodularity, compatible with a Bosniak category 2 hemorrhagic/proteinaceous renal cyst, which measured 3.8 x 2.4 cm on 06/27/2006 CT, increased in size. This complex interpolar left renal cyst appears adherent to the left psoas muscle with curvilinear asymmetric hyperenhancement along the surface of the left psoas muscle with associated surrounding asymmetry left perinephric fat stranding. No perinephric collections. Small simple renal cysts in both kidneys, largest 1.6 cm in the lower left kidney. No suspicious renal masses. Stomach/Bowel: Normal non-distended stomach. Visualized small and large bowel is normal caliber, with no bowel wall thickening. Vascular/Lymphatic: Atherosclerotic nonaneurysmal abdominal aorta. Patent portal, splenic, hepatic and renal veins. No pathologically enlarged lymph nodes in the abdomen. Other: No abdominal ascites or focal fluid collection. Musculoskeletal: No aggressive appearing focal osseous lesions. IMPRESSION: 1. Complex hemorrhagic/proteinaceous septated 5.8 cm Bosniak category 2 renal cyst in the medial interpolar left kidney, which appears adherent to the left psoas muscle with asymmetric left perinephric fat stranding and  curvilinear hyperenhancement along the surface of the left psoas muscle. Findings are nonspecific, but raise concern for an infected left renal cyst. Alternatively, findings could represent secondary reactive change to recent cyst rupture. 2. Chronic pancreatitis. No MRI findings of acute pancreatitis. 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 Follow-up MRI abdomen without and with IV contrast recommended in 12 months. This recommendation follows ACR consensus guidelines: Management of Incidental Pancreatic Cysts: A White Paper of the ACR Incidental Findings Committee. J Am Coll Radiol 5093;26:712-458. 3. No cholelithiasis. No biliary ductal dilatation. No choledocholithiasis. 4. Diffuse hepatic steatosis. 5.  Aortic Atherosclerosis (ICD10-I70.0). Electronically Signed   By: Ilona Sorrel M.D.   On: 09/23/2018 12:07       Subjective: No chest pain , or sob, or flank pain.   Discharge Exam: Vitals:   09/26/18 1030 09/26/18 1350  BP: (!) 158/86 (!) 155/86  Pulse: 77 95  Resp: 12 18  Temp:  98.8 F (37.1 C)  SpO2: 97% 96%   Vitals:   09/26/18 1020 09/26/18 1025 09/26/18 1030 09/26/18 1350  BP: (!) 142/95 (!) 152/88 (!) 158/86 (!) 155/86  Pulse: 75 76 77 95  Resp: 10  12 18   Temp:    98.8 F (37.1 C)  TempSrc:    Oral  SpO2: 98% 98% 97% 96%  Weight:      Height:        General: Pt is alert, awake, not in acute distress Cardiovascular: RRR, S1/S2 +, no rubs, no gallops Respiratory: CTA bilaterally, no wheezing, no rhonchi Abdominal: Soft, NT, ND, bowel sounds + Extremities: no edema, no cyanosis    The results of significant diagnostics from this hospitalization (including imaging, microbiology, ancillary and laboratory) are listed below for reference.     Microbiology: Recent Results (from the past 240 hour(s))  SARS Coronavirus 2 (CEPHEID - Performed in [place]  hospital lab), [facility]  Status: None   Collection Time: 09/21/18  5:11 PM  Result Value Ref Range Status   SARS Coronavirus 2 NEGATIVE NEGATIVE Final    Comment: (NOTE) If result is NEGATIVE SARS-CoV-2 target nucleic acids are NOT DETECTED. The SARS-CoV-2 RNA is generally detectable in upper and lower  respiratory specimens during the acute phase of infection. The lowest  concentration of SARS-CoV-2 viral copies this assay can detect is 250  copies / mL. A negative result does not preclude SARS-CoV-2 infection  and should not be used as the sole basis for treatment or other  patient management decisions.  A negative result may occur with  improper specimen collection / handling, submission of specimen other  than nasopharyngeal swab, presence of viral mutation(s) within the  areas targeted by this assay, and inadequate number of viral copies  (<250 copies / mL). A negative result must be combined with clinical  observations, patient history, and epidemiological information. If result is POSITIVE SARS-CoV-2 target nucleic acids are DETECTED. The SARS-CoV-2 RNA is generally detectable in upper and lower  respiratory specimens dur ing the acute phase of infection.  Positive  results are indicative of active infection with SARS-CoV-2.  Clinical  correlation with patient history and other diagnostic information is  necessary to determine patient infection status.  Positive results do  not rule out bacterial infection or co-infection with other viruses. If result is PRESUMPTIVE POSTIVE SARS-CoV-2 nucleic acids MAY BE PRESENT.   A presumptive positive result was obtained on the submitted specimen  and confirmed on repeat testing.  While 2019 novel coronavirus  (SARS-CoV-2) nucleic acids may be present in the submitted sample  additional confirmatory testing may be necessary for epidemiological  and / or clinical management purposes  to differentiate between  SARS-CoV-2 and other  Sarbecovirus currently known to infect humans.  If clinically indicated additional testing with an alternate test  methodology [phone]) is advised. The SARS-CoV-2 RNA is generally  detectable in upper and lower respiratory sp ecimens during the acute  phase of infection. The expected result is Negative. Fact Sheet for Patients:  StrictlyIdeas.no Fact Sheet for Healthcare Providers: BankingDealers.co.za This test is not yet approved or cleared by the [place] FDA and has been authorized for detection and/or diagnosis of SARS-CoV-2 by FDA under an Emergency Use Authorization (EUA).  This EUA will remain in effect (meaning this test can be used) for the duration of the COVID-19 declaration under Section 564(b)(1) of the Act, 21 U.S.C. section 360bbb-3(b)(1), unless the authorization is terminated or revoked sooner. Performed at [facility], [place] [address] [place], [place] [address]   Urine culture     Status: None   Collection Time: 09/21/18  8:05 PM  Result Value Ref Range Status   Specimen Description URINE, CLEAN CATCH  Final   Special Requests NONE  Final   Culture   Final    NO GROWTH Performed at [facility], [place] [address], [place], [place] [address]    Report Status 09/22/2018 FINAL  Final  Culture, blood (Routine X 2) w Reflex to ID Panel     Status: None (Preliminary result)   Collection Time: 09/23/18  1:21 PM  Result Value Ref Range Status   Specimen Description BLOOD LEFT ANTECUBITAL  Final   Special Requests   Final    BOTTLES DRAWN AEROBIC ONLY Blood Culture adequate volume   Culture   Final    NO GROWTH 3 DAYS Performed at [facility],  [address], [place], [place] [address]    Report Status PENDING  Incomplete  Culture, blood (Routine X 2) w Reflex to ID Panel     Status: None (Preliminary result)   Collection Time: 09/23/18  1:34 PM  Result Value Ref Range Status   Specimen  Description BLOOD LEFT HAND  Final   Special Requests   Final    BOTTLES DRAWN AEROBIC ONLY Blood Culture adequate volume   Culture   Final    NO GROWTH 3 DAYS Performed at [facility], [place] [address], [place], [place] [address]    Report Status PENDING  Incomplete  Aerobic/Anaerobic Culture (surgical/deep wound)     Status: None (Preliminary result)   Collection Time: 09/26/18 11:29 AM  Result Value Ref Range Status   Specimen Description ABSCESS LEFT KIDNEY HEMORRHAGIC CYST  Final   Special Requests Normal  Final   Gram Stain   Final    NO WBC SEEN NO ORGANISMS SEEN Performed at [facility], [place] [address] [place], [place] [address]    Culture PENDING  Incomplete   Report Status PENDING  Incomplete     Labs: BNP (last 3 results) No results for input(s): BNP in the last 8760 hours. Basic Metabolic Panel: Recent Labs  Lab 09/21/18 1711 09/22/18 0250 09/23/18 0247 09/25/18 0401  NA 136 135 135 136  K 3.8 3.7 3.8 3.6  CL 100 103 104 100  CO2 20* 22 21* 25  GLUCOSE 168* 180* 151* 173*  BUN 9 12 12 12   CREATININE 0.97 0.81 0.93 1.03  CALCIUM 9.3 8.8* 8.5* 8.3*   Liver Function Tests: Recent Labs  Lab 09/21/18 1711  AST 34  ALT 19  ALKPHOS 92  BILITOT 0.8  PROT 7.9  ALBUMIN 4.1   Recent Labs  Lab 09/21/18 1711  LIPASE 29   No results for input(s): AMMONIA in the last 168 hours. CBC: Recent Labs  Lab 09/21/18 1711 09/22/18 0250 09/23/18 0247 09/25/18 0401  WBC 18.9* 19.2* 16.0* 9.7  NEUTROABS 15.9*  --   --   --   HGB 16.6 15.8 14.5 13.4  HCT 48.2 45.8 42.9 39.6  MCV 89.1 88.1 89.4 90.8  PLT 251 243 235 235   Cardiac Enzymes: No results for input(s): CKTOTAL, CKMB, CKMBINDEX, TROPONINI in the last 168 hours. BNP: Invalid input(s): POCBNP CBG: Recent Labs  Lab 09/25/18 2007 09/25/18 2224 09/26/18 0835 09/26/18 1159 09/26/18 1735  GLUCAP 232* 238* 163* 145* 240*   D-Dimer No results for input(s): DDIMER in the last 72  hours. Hgb A1c No results for input(s): HGBA1C in the last 72 hours. Lipid Profile No results for input(s): CHOL, HDL, LDLCALC, TRIG, CHOLHDL, LDLDIRECT in the last 72 hours. Thyroid function studies No results for input(s): TSH, T4TOTAL, T3FREE, THYROIDAB in the last 72 hours.  Invalid input(s): FREET3 Anemia work up No results for input(s): VITAMINB12, FOLATE, FERRITIN, TIBC, IRON, RETICCTPCT in the last 72 hours. Urinalysis    Component Value Date/Time   COLORURINE YELLOW 09/24/2018 2256   APPEARANCEUR CLOUDY (A) 09/24/2018 2256   LABSPEC 1.012 09/24/2018 2256   PHURINE 6.0 09/24/2018 2256   GLUCOSEU >=500 (A) 09/24/2018 2256   HGBUR LARGE (A) 09/24/2018 2256   BILIRUBINUR NEGATIVE 09/24/2018 2256   KETONESUR NEGATIVE 09/24/2018 2256   PROTEINUR 100 (A) 09/24/2018 2256   UROBILINOGEN 0.2 03/01/2010 0330   NITRITE NEGATIVE 09/24/2018 2256   LEUKOCYTESUR LARGE (A) 09/24/2018 2256   Sepsis Labs Invalid input(s):  PROCALCITONIN,  WBC,  LACTICIDVEN Microbiology Recent Results (from the past 240 hour(s))  SARS Coronavirus 2 (CEPHEID - Performed in [place] [facility] lab), Hosp Order     Status: None   Collection Time: 09/21/18  5:11 PM  Result Value Ref Range Status   SARS Coronavirus 2 NEGATIVE NEGATIVE Final    Comment: (NOTE) If result is NEGATIVE SARS-CoV-2 target nucleic acids are NOT DETECTED. The SARS-CoV-2 RNA is generally detectable in upper and lower  respiratory specimens during the acute phase of infection. The lowest  concentration of SARS-CoV-2 viral copies this assay can detect is 250  copies / mL. A negative result does not preclude SARS-CoV-2 infection  and should not be used as the sole basis for treatment or other  patient management decisions.  A negative result may occur with  improper specimen collection / handling, submission of specimen other  than nasopharyngeal swab, presence of viral mutation(s) within the  areas targeted by this assay, and  inadequate number of viral copies  (<250 copies / mL). A negative result must be combined with clinical  observations, patient history, and epidemiological information. If result is POSITIVE SARS-CoV-2 target nucleic acids are DETECTED. The SARS-CoV-2 RNA is generally detectable in upper and lower  respiratory specimens dur ing the acute phase of infection.  Positive  results are indicative of active infection with SARS-CoV-2.  Clinical  correlation with patient history and other diagnostic information is  necessary to determine patient infection status.  Positive results do  not rule out bacterial infection or co-infection with other viruses. If result is PRESUMPTIVE POSTIVE SARS-CoV-2 nucleic acids MAY BE PRESENT.   A presumptive positive result was obtained on the submitted specimen  and confirmed on repeat testing.  While 2019 novel coronavirus  (SARS-CoV-2) nucleic acids may be present in the submitted sample  additional confirmatory testing may be necessary for epidemiological  and / or clinical management purposes  to differentiate between  SARS-CoV-2 and other Sarbecovirus currently known to infect humans.  If clinically indicated additional testing with an alternate test  methodology [phone] is advised. The SARS-CoV-2 RNA is generally  detectable in upper and lower respiratory sp ecimens during the acute  phase of infection. The expected result is Negative. Fact Sheet for Patients:  StrictlyIdeas.no Fact Sheet for Healthcare Providers: BankingDealers.co.za This test is not yet approved or cleared by the [place] FDA and has been authorized for detection and/or diagnosis of SARS-CoV-2 by FDA under an Emergency Use Authorization (EUA).  This EUA will remain in effect (meaning this test can be used) for the duration of the COVID-19 declaration under Section 564(b)(1) of the Act, 21 U.S.C. section 360bbb-3(b)(1), unless the  authorization is terminated or revoked sooner. Performed at [facility], [place] [address], [place], [place] [address]   Urine culture     Status: None   Collection Time: 09/21/18  8:05 PM  Result Value Ref Range Status   Specimen Description URINE, CLEAN CATCH  Final   Special Requests NONE  Final   Culture   Final    NO GROWTH Performed at [facility], [place] [address], [place], [place] [address]    Report Status 09/22/2018 FINAL  Final  Culture, blood (Routine X 2) w Reflex to ID Panel     Status: None (Preliminary result)   Collection Time: 09/23/18  1:21 PM  Result Value Ref Range Status   Specimen Description BLOOD LEFT ANTECUBITAL  Final   Special Requests  Final    BOTTLES DRAWN AEROBIC ONLY Blood Culture adequate volume   Culture   Final    NO GROWTH 3 DAYS Performed at [facility], [place] [address] [place], [place] [address]    Report Status PENDING  Incomplete  Culture, blood (Routine X 2) w Reflex to ID Panel     Status: None (Preliminary result)   Collection Time: 09/23/18  1:34 PM  Result Value Ref Range Status   Specimen Description BLOOD LEFT HAND  Final   Special Requests   Final    BOTTLES DRAWN AEROBIC ONLY Blood Culture adequate volume   Culture   Final    NO GROWTH 3 DAYS Performed at [facility], [place] [address], [place], [place] [address]    Report Status PENDING  Incomplete  Aerobic/Anaerobic Culture (surgical/deep wound)     Status: None (Preliminary result)   Collection Time: 09/26/18 11:29 AM  Result Value Ref Range Status   Specimen Description ABSCESS LEFT KIDNEY HEMORRHAGIC CYST  Final   Special Requests Normal  Final   Gram Stain   Final    NO WBC SEEN NO ORGANISMS SEEN Performed at [facility], [place] [address] [place], [place] [address]    Culture PENDING  Incomplete   Report Status PENDING  Incomplete     Time coordinating discharge: 32  minutes  SIGNED:   Hosie Poisson, MD  [facility] 09/27/2018, 8:08 AM Pager   If 7PM-7AM, please contact night-coverage [email or web address] Password [email or web address]

## 2018-09-28 ENCOUNTER — Emergency Department (HOSPITAL_COMMUNITY): Payer: Medicaid Other

## 2018-09-28 LAB — I-STAT CREATININE, ED: Creatinine, Ser: 1.1 mg/dL (ref 0.61–1.24)

## 2018-09-28 LAB — TYPE AND SCREEN
ABO/RH(D): A NEG
Antibody Screen: NEGATIVE

## 2018-09-28 LAB — CULTURE, BLOOD (ROUTINE X 2)
Culture: NO GROWTH
Culture: NO GROWTH
Special Requests: ADEQUATE
Special Requests: ADEQUATE

## 2018-09-28 LAB — ABO/RH: ABO/RH(D): A NEG

## 2018-09-28 LAB — POCT I-STAT EG7
Bicarbonate: 24.5 mmol/L (ref 20.0–28.0)
Calcium, Ion: 1.13 mmol/L — ABNORMAL LOW (ref 1.15–1.40)
HCT: 46 % (ref 39.0–52.0)
Hemoglobin: 15.6 g/dL (ref 13.0–17.0)
O2 Saturation: 98 %
Potassium: 3.9 mmol/L (ref 3.5–5.1)
Sodium: 137 mmol/L (ref 135–145)
TCO2: 26 mmol/L (ref 22–32)
pCO2, Ven: 40.3 mmHg — ABNORMAL LOW (ref 44.0–60.0)
pH, Ven: 7.392 (ref 7.250–7.430)
pO2, Ven: 106 mmHg — ABNORMAL HIGH (ref 32.0–45.0)

## 2018-09-28 MED ORDER — IOHEXOL 300 MG/ML  SOLN
125.0000 mL | Freq: Once | INTRAMUSCULAR | Status: AC | PRN
  Administered 2018-09-28: 125 mL via INTRAVENOUS

## 2018-09-28 MED ORDER — IBUPROFEN 800 MG PO TABS: 800.0000 mg | ORAL_TABLET | Freq: Three times a day (TID) | ORAL | 0 refills | Status: DC

## 2018-09-28 MED ORDER — OXYCODONE-ACETAMINOPHEN 5-325 MG PO TABS: 1.0000 | ORAL_TABLET | ORAL | 0 refills | Status: DC | PRN

## 2018-09-28 NOTE — ED Notes (Signed)
Pt discharged from ED; instructions provided and scripts given; Pt encouraged to return to ED if symptoms worsen and to f/u with PCP; Pt verbalized understanding of all instructions 
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
## 2018-09-28 NOTE — Discharge Instructions (Signed)
Your imaging today did not show any acute traumatic injuries, just hematoma to the left side of your head.  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  Your primary care doctor can schedule this for you. Take the prescribed medication as directed.  Be very careful with percocet, it can be addictive. Follow-up with your primary care doctor. Return to the ED for new or worsening symptoms.

## 2018-10-01 LAB — AEROBIC/ANAEROBIC CULTURE W GRAM STAIN (SURGICAL/DEEP WOUND)
Culture: NO GROWTH
Gram Stain: NONE SEEN
Special Requests: NORMAL

## 2018-10-02 ENCOUNTER — Encounter (HOSPITAL_COMMUNITY): Payer: Self-pay

## 2018-10-02 ENCOUNTER — Other Ambulatory Visit: Payer: Self-pay

## 2018-10-02 ENCOUNTER — Emergency Department (HOSPITAL_COMMUNITY)
Admission: EM | Admit: 2018-10-02 | Discharge: 2018-10-02 | Disposition: A | Discharge status: Home / Self Care | Payer: Medicaid Other | Attending: Emergency Medicine | Admitting: Emergency Medicine

## 2018-10-02 DIAGNOSIS — Z794 Long term (current) use of insulin: Secondary | ICD-10-CM | POA: Diagnosis not present

## 2018-10-02 DIAGNOSIS — S3992XA Unspecified injury of lower back, initial encounter: Secondary | ICD-10-CM | POA: Diagnosis present

## 2018-10-02 DIAGNOSIS — Y999 Unspecified external cause status: Secondary | ICD-10-CM | POA: Diagnosis not present

## 2018-10-02 DIAGNOSIS — E119 Type 2 diabetes mellitus without complications: Secondary | ICD-10-CM | POA: Diagnosis not present

## 2018-10-02 DIAGNOSIS — Y939 Activity, unspecified: Secondary | ICD-10-CM | POA: Insufficient documentation

## 2018-10-02 DIAGNOSIS — Y929 Unspecified place or not applicable: Secondary | ICD-10-CM | POA: Insufficient documentation

## 2018-10-02 DIAGNOSIS — S39012A Strain of muscle, fascia and tendon of lower back, initial encounter: Secondary | ICD-10-CM

## 2018-10-02 DIAGNOSIS — Z79899 Other long term (current) drug therapy: Secondary | ICD-10-CM | POA: Insufficient documentation

## 2018-10-02 DIAGNOSIS — I10 Essential (primary) hypertension: Secondary | ICD-10-CM | POA: Diagnosis not present

## 2018-10-02 DIAGNOSIS — F17210 Nicotine dependence, cigarettes, uncomplicated: Secondary | ICD-10-CM | POA: Insufficient documentation

## 2018-10-02 MED ORDER — CYCLOBENZAPRINE HCL 10 MG PO TABS: 10.0000 mg | ORAL_TABLET | Freq: Three times a day (TID) | ORAL | 0 refills | Status: DC | PRN

## 2018-10-02 MED ORDER — LIDOCAINE 5 % EX PTCH
2.0000 | MEDICATED_PATCH | Freq: Once | CUTANEOUS | Status: DC
  Administered 2018-10-02: 2 via TRANSDERMAL
  Filled 2018-10-02: qty 2

## 2018-10-02 MED ORDER — ACETAMINOPHEN 500 MG PO TABS
1000.0000 mg | ORAL_TABLET | Freq: Once | ORAL | Status: AC
  Administered 2018-10-02: 22:00:00 1000 mg via ORAL
  Filled 2018-10-02: qty 2

## 2018-10-02 MED ORDER — LIDOCAINE 5 % EX PTCH: 1.0000 | MEDICATED_PATCH | CUTANEOUS | 0 refills | Status: DC
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
## 2018-10-02 NOTE — ED Notes (Signed)
E-signature not available, verbalized understanding of DC instructions and prescriptions.  

## 2018-10-02 NOTE — ED Triage Notes (Signed)
Pt presents for back and flank pain since Friday. Pt states he has been taking pain medication w/o relief. Pt also endorses bilateral leg pain. Pt ambulatory with steady gait.

## 2018-10-12 ENCOUNTER — Other Ambulatory Visit: Payer: Self-pay

## 2018-10-12 ENCOUNTER — Emergency Department (HOSPITAL_COMMUNITY)
Admission: EM | Admit: 2018-10-12 | Discharge: 2018-10-12 | Disposition: A | Discharge status: Home / Self Care | Payer: Medicaid Other | Attending: Emergency Medicine | Admitting: Emergency Medicine

## 2018-10-12 ENCOUNTER — Encounter (HOSPITAL_COMMUNITY): Payer: Self-pay | Admitting: Emergency Medicine

## 2018-10-12 ENCOUNTER — Emergency Department (HOSPITAL_COMMUNITY): Payer: Medicaid Other

## 2018-10-12 DIAGNOSIS — M25552 Pain in left hip: Secondary | ICD-10-CM | POA: Diagnosis present

## 2018-10-12 DIAGNOSIS — Z79899 Other long term (current) drug therapy: Secondary | ICD-10-CM | POA: Insufficient documentation

## 2018-10-12 DIAGNOSIS — I10 Essential (primary) hypertension: Secondary | ICD-10-CM | POA: Insufficient documentation

## 2018-10-12 DIAGNOSIS — R0789 Other chest pain: Secondary | ICD-10-CM | POA: Insufficient documentation

## 2018-10-12 DIAGNOSIS — F17210 Nicotine dependence, cigarettes, uncomplicated: Secondary | ICD-10-CM | POA: Diagnosis not present

## 2018-10-12 DIAGNOSIS — F1290 Cannabis use, unspecified, uncomplicated: Secondary | ICD-10-CM | POA: Diagnosis not present

## 2018-10-12 DIAGNOSIS — M25562 Pain in left knee: Secondary | ICD-10-CM | POA: Insufficient documentation

## 2018-10-12 DIAGNOSIS — E119 Type 2 diabetes mellitus without complications: Secondary | ICD-10-CM | POA: Insufficient documentation

## 2018-10-12 MED ORDER — LIDOCAINE 5 % EX PTCH
1.0000 | MEDICATED_PATCH | CUTANEOUS | Status: DC
  Administered 2018-10-12: 22:00:00 1 via TRANSDERMAL
  Filled 2018-10-12: qty 1

## 2018-10-12 MED ORDER — HYDROCODONE-ACETAMINOPHEN 5-325 MG PO TABS
1.0000 | ORAL_TABLET | Freq: Once | ORAL | Status: AC
  Administered 2018-10-12: 22:00:00 1 via ORAL
  Filled 2018-10-12: qty 1

## 2018-10-12 MED ORDER — LIDOCAINE 5 % EX PTCH: 1.0000 | MEDICATED_PATCH | CUTANEOUS | 0 refills | Status: DC
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
## 2018-10-12 NOTE — ED Notes (Signed)
Pt discharged from ED; instructions provided and scripts given; Pt encouraged to return to ED if symptoms worsen and to f/u with PCP; Pt verbalized understanding of all instructions 
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
## 2018-10-12 NOTE — ED Notes (Signed)
Pt to xray at this time.
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
## 2018-10-19 ENCOUNTER — Encounter (HOSPITAL_COMMUNITY): Payer: Self-pay | Admitting: *Deleted

## 2018-10-19 ENCOUNTER — Emergency Department (HOSPITAL_COMMUNITY)
Admission: EM | Admit: 2018-10-19 | Discharge: 2018-10-19 | Disposition: A | Discharge status: Home / Self Care | Payer: No Typology Code available for payment source | Attending: Emergency Medicine | Admitting: Emergency Medicine

## 2018-10-19 ENCOUNTER — Other Ambulatory Visit: Payer: Self-pay

## 2018-10-19 DIAGNOSIS — E119 Type 2 diabetes mellitus without complications: Secondary | ICD-10-CM | POA: Insufficient documentation

## 2018-10-19 DIAGNOSIS — Z794 Long term (current) use of insulin: Secondary | ICD-10-CM | POA: Diagnosis not present

## 2018-10-19 DIAGNOSIS — M791 Myalgia, unspecified site: Secondary | ICD-10-CM | POA: Diagnosis not present

## 2018-10-19 DIAGNOSIS — F17210 Nicotine dependence, cigarettes, uncomplicated: Secondary | ICD-10-CM | POA: Diagnosis not present

## 2018-10-19 DIAGNOSIS — M25562 Pain in left knee: Secondary | ICD-10-CM | POA: Diagnosis present

## 2018-10-19 DIAGNOSIS — I10 Essential (primary) hypertension: Secondary | ICD-10-CM | POA: Diagnosis not present

## 2018-10-19 DIAGNOSIS — M7918 Myalgia, other site: Secondary | ICD-10-CM

## 2018-10-19 DIAGNOSIS — Z79899 Other long term (current) drug therapy: Secondary | ICD-10-CM | POA: Diagnosis not present
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
## 2018-10-19 NOTE — ED Notes (Signed)
Patient verbalizes understanding of discharge instructions. Opportunity for questioning and answers were provided. Armband removed by staff, pt discharged from ED.  
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
## 2018-10-19 NOTE — Discharge Instructions (Addendum)
Please read attached information. If you experience any new or worsening signs or symptoms please return to the emergency room for evaluation. Please follow-up with your primary care provider or specialist as discussed.  °

## 2018-10-21 ENCOUNTER — Telehealth: Payer: Self-pay

## 2018-10-21 ENCOUNTER — Other Ambulatory Visit: Payer: Self-pay | Admitting: Orthopedic Surgery

## 2018-10-21 DIAGNOSIS — M5416 Radiculopathy, lumbar region: Secondary | ICD-10-CM

## 2018-10-21 DIAGNOSIS — M5116 Intervertebral disc disorders with radiculopathy, lumbar region: Secondary | ICD-10-CM | POA: Insufficient documentation

## 2018-10-21 NOTE — Telephone Encounter (Signed)
Spoke with patient to screen his medications and allergies before being scheduled for a lumbar myelogram.  He was informed he will be here 2-2.5 hours, will need a driver and will need to be on strict bedrest for 24 hours after the procedure.  He does not need to hold any medications.

## 2018-11-21 NOTE — Discharge Instructions (Signed)

## 2018-11-22 ENCOUNTER — Other Ambulatory Visit: Payer: Medicare Other

## 2018-11-22 ENCOUNTER — Inpatient Hospital Stay
Admission: RE | Admit: 2018-11-22 | Discharge: 2018-11-22 | Disposition: A | Discharge status: Home / Self Care | Payer: Medicare Other | Source: Ambulatory Visit | Attending: Orthopedic Surgery | Admitting: Orthopedic Surgery

## 2018-12-13 DIAGNOSIS — D119 Benign neoplasm of major salivary gland, unspecified: Secondary | ICD-10-CM | POA: Insufficient documentation

## 2018-12-16 ENCOUNTER — Ambulatory Visit
Admission: RE | Admit: 2018-12-16 | Discharge: 2018-12-16 | Disposition: A | Discharge status: Home / Self Care | Payer: Medicare Other | Source: Ambulatory Visit | Attending: Orthopedic Surgery | Admitting: Orthopedic Surgery

## 2018-12-16 ENCOUNTER — Other Ambulatory Visit: Payer: Self-pay

## 2018-12-16 VITALS — BP 124/65 | HR 73

## 2018-12-16 DIAGNOSIS — M5416 Radiculopathy, lumbar region: Secondary | ICD-10-CM

## 2018-12-16 DIAGNOSIS — M5116 Intervertebral disc disorders with radiculopathy, lumbar region: Secondary | ICD-10-CM

## 2018-12-16 MED ORDER — IOPAMIDOL (ISOVUE-M 200) INJECTION 41%
15.0000 mL | Freq: Once | INTRAMUSCULAR | Status: AC
  Administered 2018-12-16: 14:00:00 15 mL via INTRATHECAL

## 2018-12-16 MED ORDER — DIAZEPAM 5 MG PO TABS: 5.0000 mg | ORAL_TABLET | Freq: Once | ORAL | Status: DC

## 2018-12-16 NOTE — Discharge Instructions (Signed)

## 2019-01-17 ENCOUNTER — Ambulatory Visit: Payer: Medicare Other | Admitting: Physical Therapy

## 2019-01-17 ENCOUNTER — Other Ambulatory Visit: Payer: Self-pay

## 2019-01-18 ENCOUNTER — Other Ambulatory Visit: Payer: Self-pay

## 2019-01-18 ENCOUNTER — Ambulatory Visit: Payer: Medicare Other | Attending: Family Medicine | Admitting: Physical Therapy

## 2019-01-18 ENCOUNTER — Encounter: Payer: Self-pay | Admitting: Physical Therapy

## 2019-01-18 DIAGNOSIS — M545 Low back pain, unspecified: Secondary | ICD-10-CM

## 2019-01-18 DIAGNOSIS — R2689 Other abnormalities of gait and mobility: Secondary | ICD-10-CM | POA: Insufficient documentation

## 2019-01-18 DIAGNOSIS — G8929 Other chronic pain: Secondary | ICD-10-CM | POA: Diagnosis present

## 2019-01-18 NOTE — Addendum Note (Signed)
Addended by: Carney Living on: 01/18/2019 02:46 PM   Modules accepted: Orders
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
## 2019-01-27 ENCOUNTER — Encounter

## 2019-01-29 IMAGING — DX DG KNEE COMPLETE 4+V*R*
4 series · 4 of 4 positions shown · non-contrast
Comparison: None.

CLINICAL DATA: Fall today with knee pain and swelling, initial
encounter

EXAM:
RIGHT KNEE - COMPLETE 4+ VIEW

[knee ap]
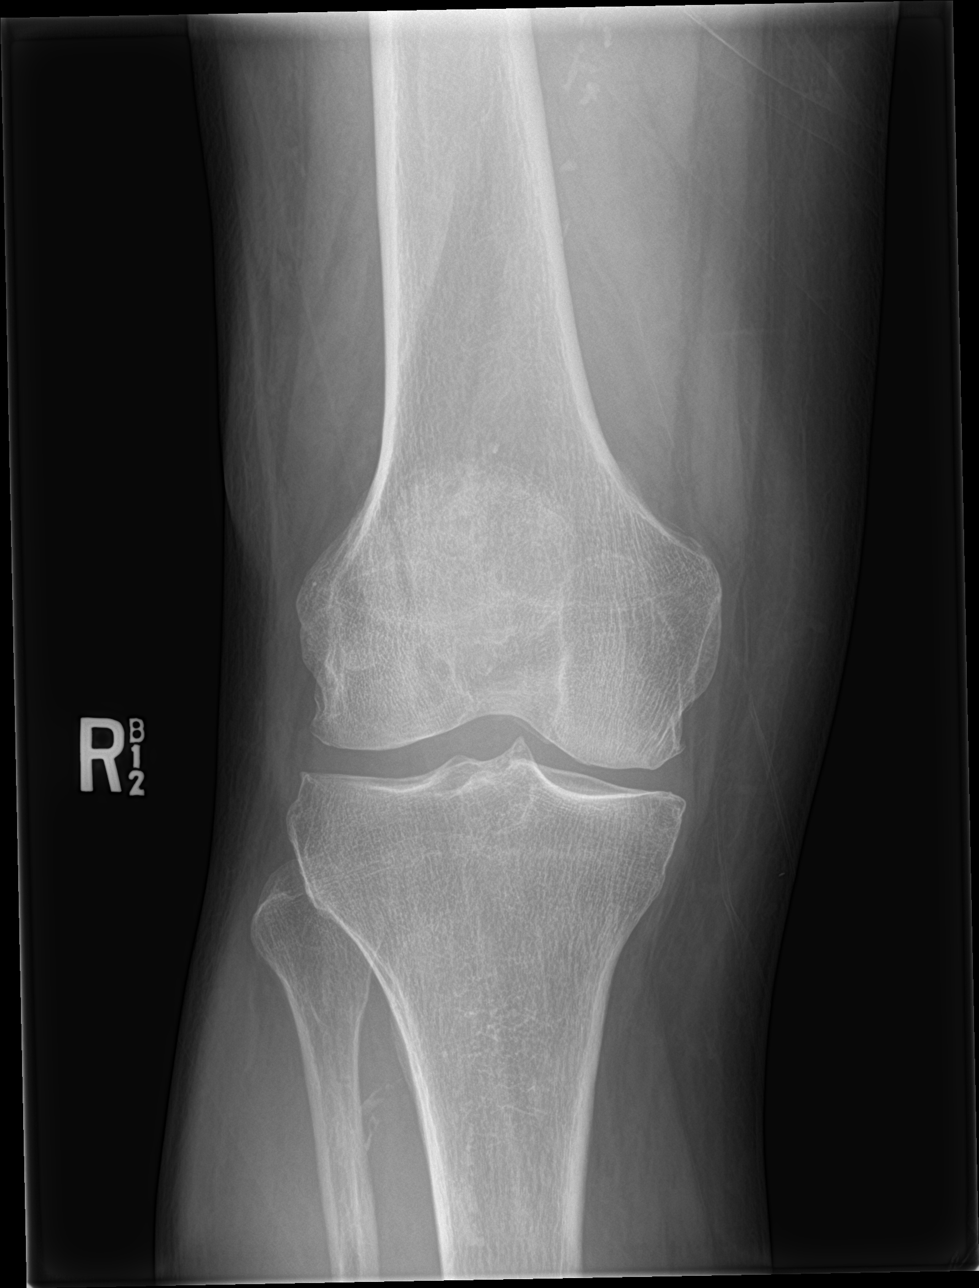

[knee lat]
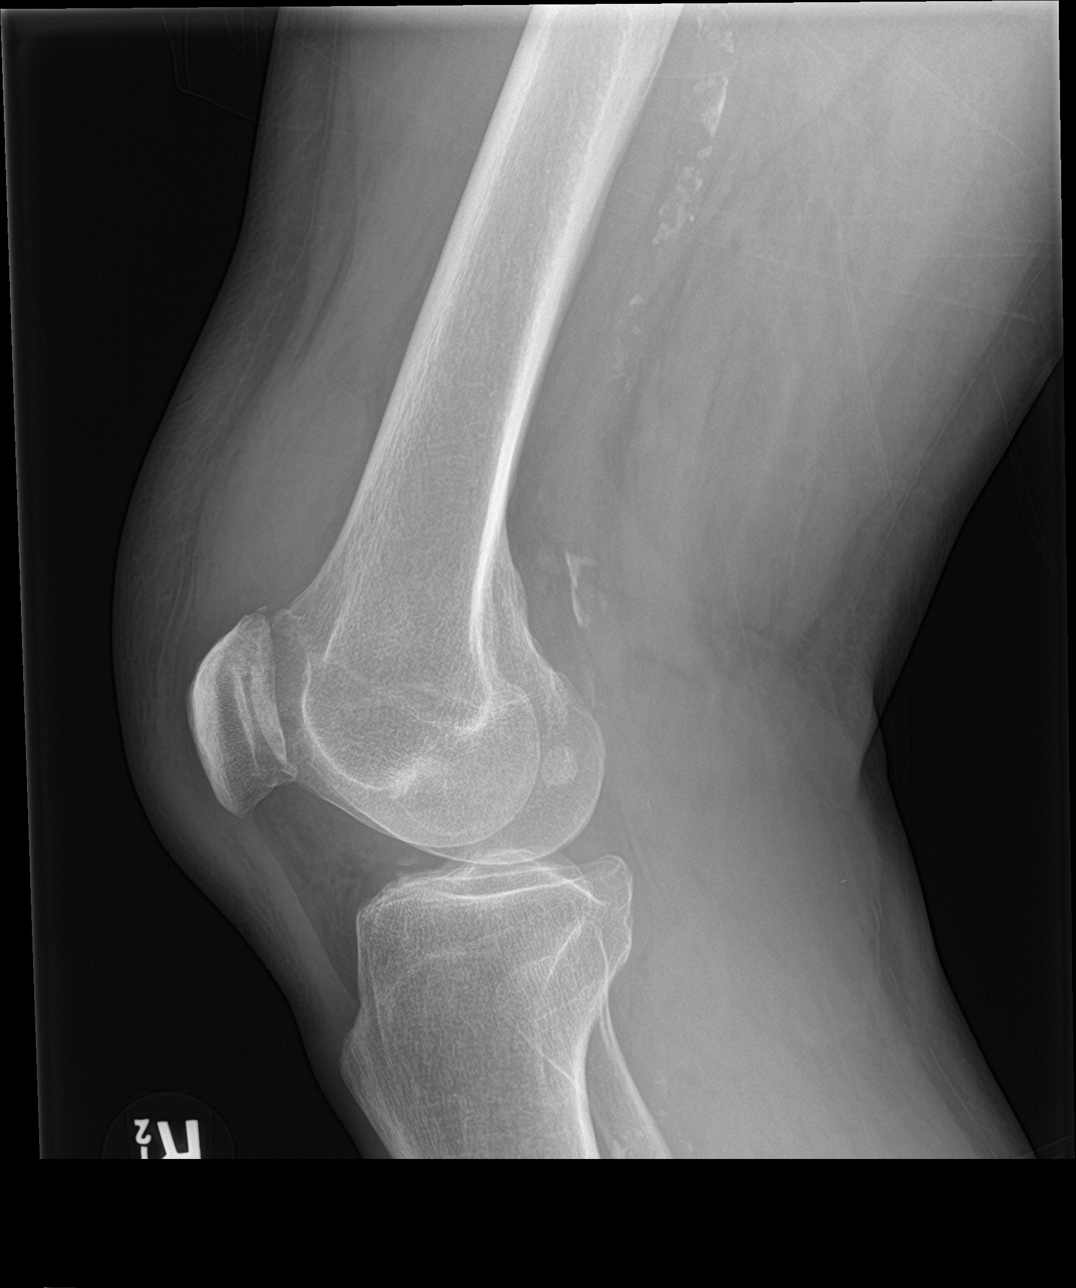

[knee obl (1 of 2)]
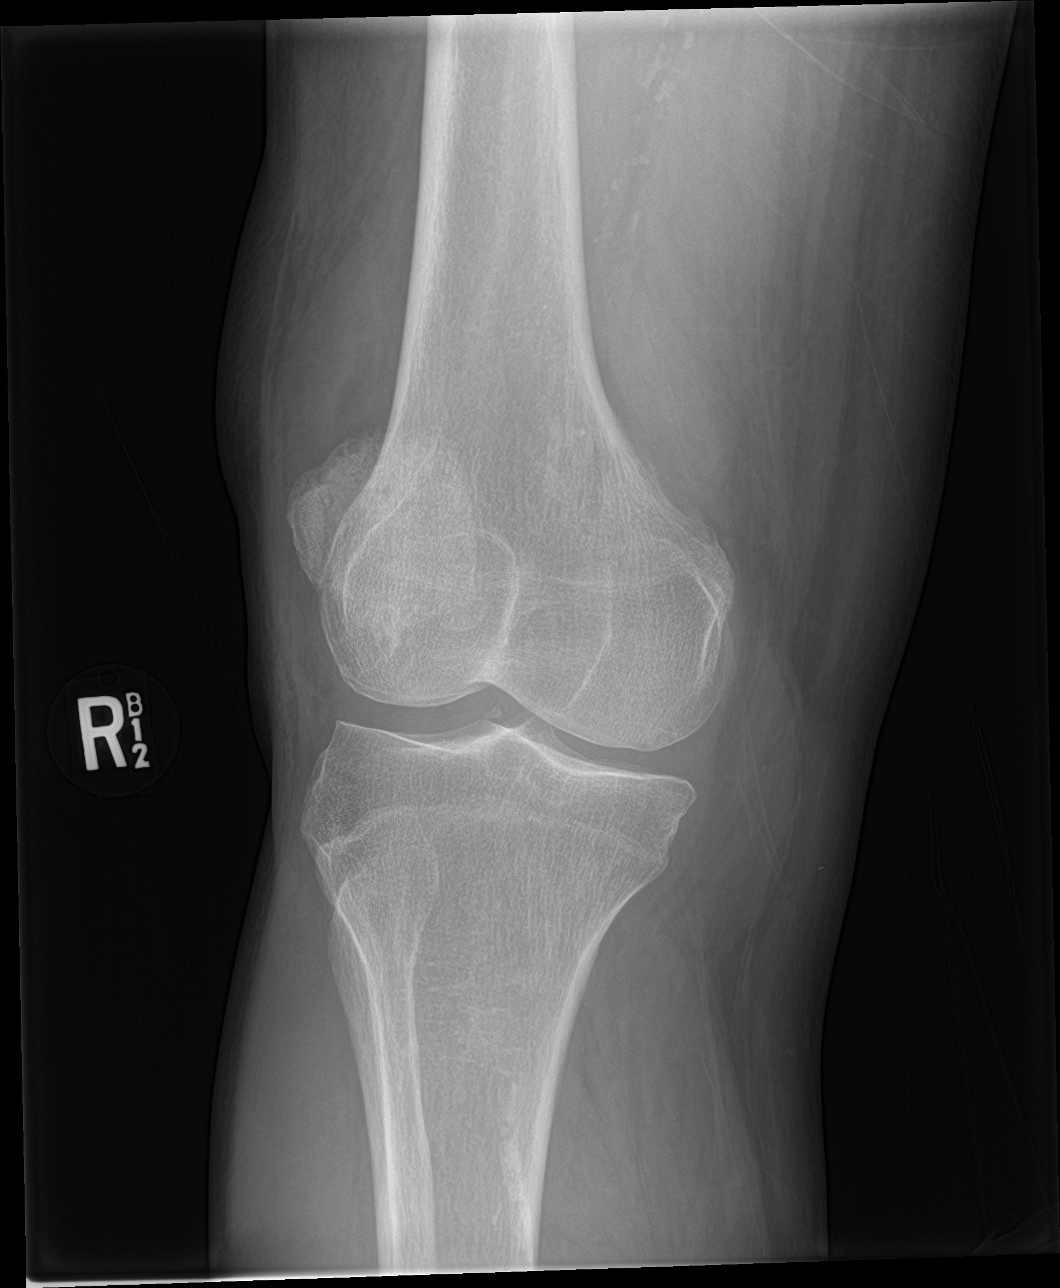

[knee obl (2 of 2)]
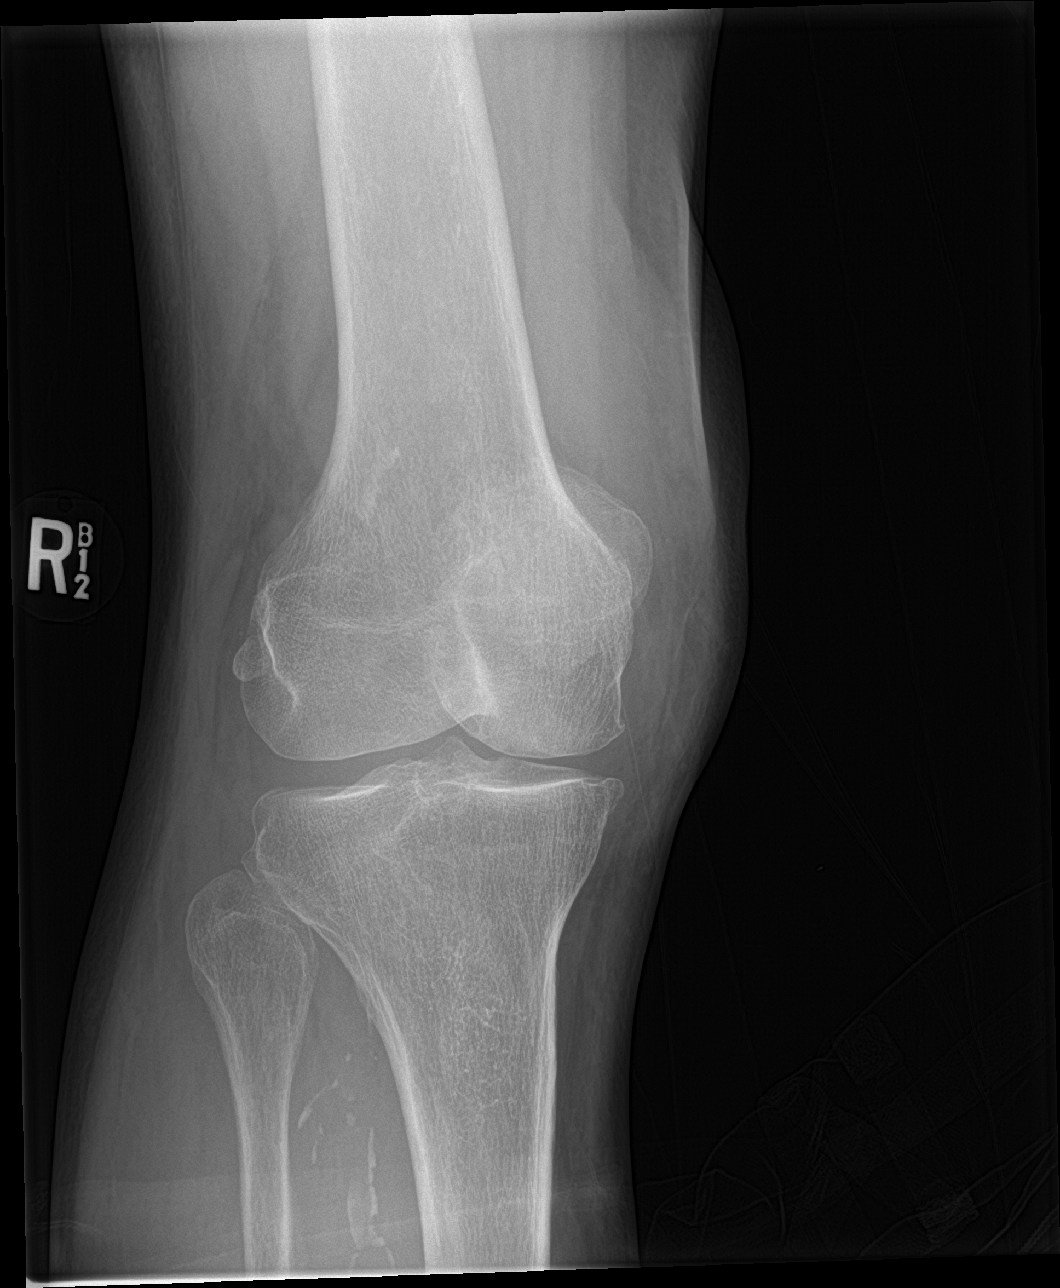

[4 of 4 positions shown; findings below may reference images not displayed]

FINDINGS: Moderate joint effusion is noted. Some anterior soft tissue swelling
is noted related to the recent injury. Diffuse vascular
calcifications are seen. Mild degenerative changes are seen. Lucency
is noted vertically in the lateral aspect of the patella suspicious
for an undisplaced fracture. No other fractures are seen.
IMPRESSION: Joint effusion with findings suspicious for a lateral patellar
fracture

## 2019-02-01 ENCOUNTER — Ambulatory Visit: Payer: Medicare Other | Admitting: Physical Therapy

## 2019-02-08 ENCOUNTER — Ambulatory Visit: Payer: Medicare Other | Admitting: Physical Therapy

## 2019-02-09 ENCOUNTER — Telehealth: Payer: Self-pay | Admitting: Physical Therapy

## 2019-02-09 NOTE — Telephone Encounter (Signed)
Patient contacted regarding 2nd no-show visit. Patient contacted last time as well. Patient reports he is having difficulty keeping track of his appointments. He reports he will write the next one down and attend. Patient advised that per attendance policy 1 visit will be kept and the remainder will be canceled. He can then make 1 visit at a time.

## 2019-02-11 ENCOUNTER — Emergency Department (HOSPITAL_COMMUNITY)
Admission: EM | Admit: 2019-02-11 | Discharge: 2019-02-11 | Disposition: A | Discharge status: Home / Self Care | Payer: Medicare Other | Attending: Emergency Medicine | Admitting: Emergency Medicine

## 2019-02-11 DIAGNOSIS — F419 Anxiety disorder, unspecified: Secondary | ICD-10-CM

## 2019-02-11 DIAGNOSIS — F329 Major depressive disorder, single episode, unspecified: Secondary | ICD-10-CM | POA: Diagnosis not present

## 2019-02-11 DIAGNOSIS — R440 Auditory hallucinations: Secondary | ICD-10-CM | POA: Diagnosis not present

## 2019-02-11 DIAGNOSIS — E119 Type 2 diabetes mellitus without complications: Secondary | ICD-10-CM | POA: Diagnosis not present

## 2019-02-11 DIAGNOSIS — I10 Essential (primary) hypertension: Secondary | ICD-10-CM | POA: Diagnosis not present

## 2019-02-11 DIAGNOSIS — Z794 Long term (current) use of insulin: Secondary | ICD-10-CM | POA: Diagnosis not present

## 2019-02-11 DIAGNOSIS — F1290 Cannabis use, unspecified, uncomplicated: Secondary | ICD-10-CM | POA: Diagnosis not present

## 2019-02-11 DIAGNOSIS — Z79899 Other long term (current) drug therapy: Secondary | ICD-10-CM | POA: Diagnosis not present

## 2019-02-11 LAB — CBC WITH DIFFERENTIAL/PLATELET
Abs Immature Granulocytes: 0.04 10*3/uL (ref 0.00–0.07)
Basophils Absolute: 0.1 10*3/uL (ref 0.0–0.1)
Basophils Relative: 1 %
Eosinophils Absolute: 0.1 10*3/uL (ref 0.0–0.5)
Eosinophils Relative: 1 %
HCT: 47.1 % (ref 39.0–52.0)
Hemoglobin: 15.9 g/dL (ref 13.0–17.0)
Immature Granulocytes: 0 %
Lymphocytes Relative: 20 %
Lymphs Abs: 2.2 10*3/uL (ref 0.7–4.0)
MCH: 30.9 pg (ref 26.0–34.0)
MCHC: 33.8 g/dL (ref 30.0–36.0)
MCV: 91.5 fL (ref 80.0–100.0)
Monocytes Absolute: 0.7 10*3/uL (ref 0.1–1.0)
Monocytes Relative: 7 %
Neutro Abs: 7.7 10*3/uL (ref 1.7–7.7)
Neutrophils Relative %: 71 %
Platelets: 339 10*3/uL (ref 150–400)
RBC: 5.15 MIL/uL (ref 4.22–5.81)
RDW: 13.5 % (ref 11.5–15.5)
WBC: 10.8 10*3/uL — ABNORMAL HIGH (ref 4.0–10.5)
nRBC: 0 % (ref 0.0–0.2)

## 2019-02-11 LAB — BASIC METABOLIC PANEL
Anion gap: 13 (ref 5–15)
BUN: 9 mg/dL (ref 8–23)
CO2: 23 mmol/L (ref 22–32)
Calcium: 9.7 mg/dL (ref 8.9–10.3)
Chloride: 100 mmol/L (ref 98–111)
Creatinine, Ser: 0.96 mg/dL (ref 0.61–1.24)
GFR calc Af Amer: >60 mL/min (ref >60)
GFR calc non Af Amer: >60 mL/min (ref >60)
Glucose, Bld: 170 mg/dL — ABNORMAL HIGH (ref 70–99)
Potassium: 4 mmol/L (ref 3.5–5.1)
Sodium: 136 mmol/L (ref 135–145)

## 2019-02-11 LAB — RAPID URINE DRUG SCREEN, HOSP PERFORMED
Amphetamines: NOT DETECTED
Barbiturates: NOT DETECTED
Benzodiazepines: POSITIVE — AB
Cocaine: NOT DETECTED
Opiates: NOT DETECTED
Tetrahydrocannabinol: POSITIVE — AB

## 2019-02-11 LAB — ETHANOL: Alcohol, Ethyl (B): <10 mg/dL (ref <10)

## 2019-02-11 MED ORDER — TRESIBA 100 UNIT/ML ~~LOC~~ SOLN: 10.0000 [IU] | Freq: Every day | SUBCUTANEOUS | 0 refills | Status: DC

## 2019-02-11 MED ORDER — TRULICITY 1.5 MG/0.5ML ~~LOC~~ SOAJ: 1.5000 mg | SUBCUTANEOUS | 0 refills | Status: DC

## 2019-02-11 MED ORDER — ALPRAZOLAM 1 MG PO TABS: 1.0000 mg | ORAL_TABLET | Freq: Three times a day (TID) | ORAL | 0 refills | Status: DC

## 2019-02-11 MED ORDER — TRULICITY 1.5 MG/0.5ML ~~LOC~~ SOAJ: 1.5000 mg | SUBCUTANEOUS | 0 refills | Status: AC

## 2019-02-11 MED ORDER — LORAZEPAM 2 MG/ML IJ SOLN
1.0000 mg | Freq: Once | INTRAMUSCULAR | Status: AC
  Administered 2019-02-11: 14:00:00 1 mg via INTRAMUSCULAR
  Filled 2019-02-11: qty 1

## 2019-02-11 MED ORDER — LISINOPRIL 20 MG PO TABS
40.0000 mg | ORAL_TABLET | Freq: Once | ORAL | Status: AC
  Administered 2019-02-11: 40 mg via ORAL
  Filled 2019-02-11: qty 2

## 2019-02-11 MED ORDER — INSULIN GLARGINE 100 UNIT/ML ~~LOC~~ SOLN
10.0000 [IU] | Freq: Every day | SUBCUTANEOUS | Status: DC
  Administered 2019-02-11: 10 [IU] via SUBCUTANEOUS
  Filled 2019-02-11: qty 0.1

## 2019-02-11 MED ORDER — ALPRAZOLAM 1 MG PO TABS: 1.0000 mg | ORAL_TABLET | Freq: Three times a day (TID) | ORAL | 0 refills | Status: AC

## 2019-02-11 MED ORDER — TRESIBA 100 UNIT/ML ~~LOC~~ SOLN: 10.0000 [IU] | Freq: Every day | SUBCUTANEOUS | 0 refills | Status: AC

## 2019-02-11 MED ORDER — INSULIN DEGLUDEC 100 UNIT/ML ~~LOC~~ SOPN: 10.0000 [IU] | PEN_INJECTOR | Freq: Every day | SUBCUTANEOUS | Status: DC

## 2019-02-11 NOTE — ED Notes (Signed)
Pt ambulatory with slow steady gait and speech.  Feels much improved and has friend to pick him up.

## 2019-02-11 NOTE — ED Notes (Signed)
Pt states he has taken too many xanax over the last 2 days.  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 Regular dose 3 times and day and is now out to early.  Pt felt anxious on arrival, but states he feels improved now.

## 2019-02-11 NOTE — ED Triage Notes (Signed)
Pt presents to ED via POV for a refill of his xanax and diabetes medications. he's tearful in triage. Pt reports he missed his appointment on the 22nd. Pt also states he's hearing voices in the walls. Denies SI/HI

## 2019-02-11 NOTE — BH Assessment (Signed)
Tele Assessment Note   Patient Name: Douglas Melendez MRN: [id number] Referring Physician: Aundra Dubin Location of Patient:  [facility] Location of Provider: [place]  DAMICHAEL PROIA is an 63 y.o. male who presented to [facility] with extreme anxiety and in benzodiazepine withdrawal.  Patient states that he has been prescribed Xanax for the past thirty-seven years and states that this is the third time that his medications have come up short.  Patient states that he is careful not to abuse his medications and states that he is not sure how this happened.  Patient states that he has an appointment with his PCP on Monday to get his medications refilled.  Patient states that he has tried to get off this medication in the past, but states that it did not work out and he states that his doctor stated, "you will never be able to come off this medication.  Patient states that he has a history of withdrawal seizures and he states that he is terrified to come off the medication.  Patient denies any history of SI/HI/Psychosis.  He states that he has never been on an inpatient psychiatric unit nor has he received any outpatient treatment. Patient states that he ran out of medication yesterday and states that he had a hard time sleeping last pm.  He denies any appetite disturbance and denies any history of abuse or self-mutilation.   Patient presented as alert, tearful and overly anxious. He did not appear to be responding to any internal stimuli.  His mood and affect were anxious and patient was tearful and he was stuttering.  His insight, judgment and impulse control where partially impaired. He was able to contract for safety.  Diagnosis: Anxiety Disorder F41.9  Past Medical History:  Past Medical History:  Diagnosis Date  . Diabetes mellitus without complication ([place])   . Hypertension     No past surgical history on file.  Family History:  Family History  Problem Relation Age of Onset  .  Diabetes Mother   . Hypertension Mother   . Diabetes Father   . Hypertension Father     Social History:  reports that he has been smoking cigarettes. He has been smoking about 2.00 packs per day. He has never used smokeless tobacco. He reports current drug use. Drug: Marijuana. He reports that he does not drink alcohol.  Additional Social History:  Alcohol / Drug Use Pain Medications: see MAR Prescriptions: see MAR Over the Counter: see MAR History of alcohol / drug use?: Yes Longest period of sobriety (when/how long): UTA Substance #1 Name of Substance 1: Marijuana 1 - Age of First Use: UTA 1 - Amount (size/oz): UTA 1 - Frequency: UTA 1 - Duration: UTA 1 - Last Use / Amount: UTA  CIWA: CIWA-Ar BP: 133/82 Pulse Rate: 78 COWS:    Allergies: No Known Allergies  Home Medications: (Not in a hospital admission)   OB/GYN Status:  No LMP for male patient.  General Assessment Data Location of Assessment: [facility] ED TTS Assessment: In system Is this a Tele or Face-to-Face Assessment?: Tele Assessment Is this an Initial Assessment or a Re-assessment for this encounter?: Initial Assessment Patient Accompanied by:: N/A Language Other than English: No Living Arrangements: Other (Comment)(has own home) What gender do you identify as?: Male Marital status: Single Living Arrangements: Alone Can pt return to current living arrangement?: Yes Admission Status: Voluntary Is patient capable of signing voluntary admission?: Yes Referral Source: Self/Family/Friend Insurance type: Medicare/Medicaid  Crisis Care Plan Living Arrangements: Alone Legal Guardian: Other:(self) Name of Psychiatrist: none Name of Therapist: none  Education Status Is patient currently in school?: No Is the patient employed, unemployed or receiving disability?: Receiving disability income  Risk to self with the past 6 months Suicidal Ideation: No Has patient been a risk to self within the past 6 months  prior to admission? : No Suicidal Intent: No Has patient had any suicidal intent within the past 6 months prior to admission? : No Is patient at risk for suicide?: No Suicidal Plan?: No Has patient had any suicidal plan within the past 6 months prior to admission? : No Access to Means: No What has been your use of drugs/alcohol within the last 12 months?: none Previous Attempts/Gestures: No How many times?: (none) Other Self Harm Risks: (anxiety ) Triggers for Past Attempts: None known Intentional Self Injurious Behavior: None Family Suicide History: No Recent stressful life event(s): Other (Comment)(minimal support) Persecutory voices/beliefs?: No Depression: No Substance abuse history and/or treatment for substance abuse?: Yes Suicide prevention information given to non-admitted patients: Not applicable  Risk to Others within the past 6 months Homicidal Ideation: No Does patient have any lifetime risk of violence toward others beyond the six months prior to admission? : No Thoughts of Harm to Others: No Current Homicidal Intent: No Current Homicidal Plan: No Access to Homicidal Means: No Identified Victim: none History of harm to others?: No Assessment of Violence: None Noted Violent Behavior Description: (none) Does patient have access to weapons?: No Criminal Charges Pending?: No Does patient have a court date: No Is patient on probation?: No  Psychosis Hallucinations: None noted Delusions: None noted  Mental Status Report Appearance/Hygiene: Unremarkable Eye Contact: Fair Motor Activity: Freedom of movement Speech: Logical/coherent Level of Consciousness: Alert Mood: Anxious Affect: Anxious Anxiety Level: Severe Thought Processes: Coherent, Relevant Judgement: Impaired Orientation: Person, Place, Time, Situation Obsessive Compulsive Thoughts/Behaviors: None  Cognitive Functioning Concentration: Decreased Memory: Recent Intact, Remote Intact Is patient  IDD: No Insight: Poor Impulse Control: Poor Appetite: Good Have you had any weight changes? : No Change Sleep: Decreased Total Hours of Sleep: (5) Vegetative Symptoms: None  ADLScreening [facility] Assessment Services) Patient's cognitive ability adequate to safely complete daily activities?: Yes Patient able to express need for assistance with ADLs?: Yes Independently performs ADLs?: Yes (appropriate for developmental age)  Prior Inpatient Therapy Prior Inpatient Therapy: No  Prior Outpatient Therapy Prior Outpatient Therapy: No Does patient have an ACCT team?: No Does patient have Intensive In-House Services?  : No Does patient have [facility] services? : No Does patient have P4CC services?: No  ADL Screening (condition at time of admission) Patient's cognitive ability adequate to safely complete daily activities?: Yes Is the patient deaf or have difficulty hearing?: No Does the patient have difficulty seeing, even when wearing glasses/contacts?: No Does the patient have difficulty concentrating, remembering, or making decisions?: No Patient able to express need for assistance with ADLs?: Yes Does the patient have difficulty dressing or bathing?: No Independently performs ADLs?: Yes (appropriate for developmental age) Does the patient have difficulty walking or climbing stairs?: No Weakness of Legs: None Weakness of Arms/Hands: None  Home Assistive Devices/Equipment Home Assistive Devices/Equipment: None  Therapy Consults (therapy consults require a physician order) PT Evaluation Needed: No OT Evalulation Needed: No SLP Evaluation Needed: No       Advance Directives (For Healthcare) Does Patient Have a Medical Advance Directive?: No Would patient like information on creating a medical advance directive?: No -  Patient declined Nutrition Screen- [facility] Adult/WL/AP Has the patient recently lost weight without trying?: No Has the patient been eating poorly because of a decreased  appetite?: No Malnutrition Screening Tool Score: 0     Disposition: Per Jettie Pagan, NP, Patient does not meet inpatient admission criteria and can follow-up with his PCP. Disposition Initial Assessment Completed for this Encounter: Yes Patient referred to: [facility], Other (Comment)(PCP)  This service was provided via telemedicine using a 2-way, interactive audio and Radiographer, therapeutic.  Names of all persons participating in this telemedicine service and their role in this encounter. Name: Shankar Stull Role: patient  Name: Kasandra Knudsen Unity Luepke Role: TTS  Name:  Role:   Name:  Role:     Reatha Armour 02/11/2019 2:49 PM

## 2019-02-11 NOTE — ED Notes (Signed)
Pt does report he was having hallucinations earlier today and his speech has become more of a stutter.   Calm and cooperative.

## 2019-02-11 NOTE — Discharge Instructions (Addendum)
Thank you for allowing me to care for you today. Please return to the emergency department if you have new or worsening symptoms. Take your medications as instructed.  ° °

## 2019-02-11 NOTE — ED Provider Notes (Signed)
[place] EMERGENCY DEPARTMENT Provider Note   CSN: [id number] Arrival date & time: 02/11/19  1126     History   Chief Complaint Chief Complaint  Patient presents with  . Medication Refill    HPI Douglas Melendez is a 63 y.o. male.     Patient is a 63 y/o male with history of diabetes, hypertension, anxiety, who presents to emergency department for anxiety.  Patient reports that he ran out of his Xanax yesterday.  Reports that he is not sure if he overtook his medication.  Reports that in the past he has had withdrawal seizures as well as severe psychosis from withdrawal.  Reports that today he started to have some hallucinations so he got scared that he is going into withdrawal and came to the emergency department.  Here for follow-up appointment with his primary care doctor for refill tomorrow.  Denies SI or HI.  He is very tearful and reports having anxiety revolving around his medications and not being able to remember his meds to help himself around the house.      Past Medical History:  Diagnosis Date  . Diabetes mellitus without complication ([place])   . Hypertension     Patient Active Problem List   Diagnosis Date Noted  . 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 Lumbar disc disease with radiculopathy 10/21/2018  . Nausea and vomiting 09/21/2018  . Hypertension associated with diabetes ([place]) 09/21/2018  . Diabetes mellitus without complication ([place]) 0000000  . Generalized anxiety disorder 09/21/2018  . Opioid use disorder ([place]) 09/21/2018  . Tobacco use 09/21/2018  . Chronic pain of both knees 07/04/2018  . Closed nondisplaced fracture of right patella 03/31/2018  . BMI 31.0-31.9,adult 10/21/2016  . 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 Severe single current episode of major depressive disorder, without psychotic features ([place]) 05/26/2016  . Chronic obstructive pulmonary disease, unspecified ([place]) 05/26/2016  . Baker's cyst of knee, left 05/26/2016   . 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 Anxiety 03/22/2016    No past surgical history on file.      Home Medications    Prior to Admission medications   Medication Sig Start Date End Date Taking? Authorizing Provider  albuterol (PROVENTIL HFA;VENTOLIN HFA) 108 (90 Base) MCG/ACT inhaler Inhale 2 puffs into the lungs every 6 (six) hours as needed for wheezing or shortness of breath.    [provider]  ALPRAZolam Duanne Moron) 1 MG tablet Take 1 tablet (1 mg total) by mouth 3 (three) times daily for 1 day. 02/11/19 02/12/19  Madilyn Hook A, PA-C  amLODipine (NORVASC) 10 MG tablet Take 1 tablet (10 mg total) by mouth daily for 14 days. 09/21/18 10/05/18  Doneta Public, MD  buprenorphine-naloxone (SUBOXONE) 8-2 MG SUBL SL tablet Place 1 tablet under the tongue 3 (three) times daily.    [provider]  cloNIDine (CATAPRES) 0.1 MG tablet Take by mouth. 10/26/18   [provider]  Dulaglutide (TRULICITY) 1.5 0000000 SOPN Inject 1.5 mg into the skin every 7 (seven) days for 28 days. 02/11/19 03/11/19  Madilyn Hook A, PA-C  glucose blood (ACCU-CHEK ACTIVE STRIPS) test strip Use as instructed 06/16/17   Hedges, Dellis Filbert, PA-C  Insulin Degludec (TRESIBA) 100 UNIT/ML SOLN Inject 10 Units into the skin daily. 02/11/19 03/13/19  Madilyn Hook A, PA-C  lisinopril (ZESTRIL) 40 MG tablet Take 1 tablet (40 mg total) by mouth daily. 09/21/18 09/21/19  Doneta Public, MD  metFORMIN (GLUCOPHAGE) 1000 MG tablet Take  1,000 mg by mouth 2 (two) times daily with a meal.  01/27/16 09/22/18  [provider]    Family History Family History  Problem Relation Age of Onset  . Diabetes Mother   . Hypertension Mother   . Diabetes Father   . Hypertension Father     Social History Social History   Tobacco Use  . Smoking status: Current Every Day Smoker    Packs/day: 2.00    Types: Cigarettes  . Smokeless tobacco: Never Used  Substance Use Topics  .  Alcohol use: No  . Drug use: Yes    Types: Marijuana    Comment: occasional marijuana     Allergies   Patient has no known allergies.   Review of Systems Review of Systems  Constitutional: Negative for appetite change, chills and fever.  Psychiatric/Behavioral: Positive for confusion, hallucinations and sleep disturbance. Negative for behavioral problems, self-injury and suicidal ideas. The patient is nervous/anxious.   All other systems reviewed and are negative.    Physical Exam Updated Vital Signs BP (!) 152/88   Pulse (!) 110   Temp 98.3 F (36.8 C) (Oral)   Resp 16   SpO2 98%   Physical Exam Vitals signs and nursing note reviewed.  Constitutional:      General: He is not in acute distress.    Appearance: Normal appearance. He is obese. He is not diaphoretic.  HENT:     Head: Normocephalic.     Mouth/Throat:     Mouth: Mucous membranes are moist.  Eyes:     Conjunctiva/sclera: Conjunctivae normal.     Pupils: Pupils are equal, round, and reactive to light.  Pulmonary:     Effort: Pulmonary effort is normal.  Skin:    General: Skin is dry.  Neurological:     Mental Status: He is alert and oriented to person, place, and time.  Psychiatric:        Mood and Affect: Mood normal.     Comments: Patient is anxious, tearful      ED Treatments / Results  Labs (all labs ordered are listed, but only abnormal results are displayed) Labs Reviewed  CBC WITH DIFFERENTIAL/PLATELET - Abnormal; Notable for the following components:      Result Value   WBC 10.8 (*)    All other components within normal limits  BASIC METABOLIC PANEL - Abnormal; Notable for the following components:   Glucose, Bld 170 (*)    All other components within normal limits  RAPID URINE DRUG SCREEN, HOSP PERFORMED - Abnormal; Notable for the following components:   Benzodiazepines POSITIVE (*)    Tetrahydrocannabinol POSITIVE (*)    All other components within normal limits  ETHANOL     EKG None  Radiology No results found.  Procedures Procedures (including critical care time)  Medications Ordered in ED Medications  insulin glargine (LANTUS) injection 10 Units (has no administration in time range)  LORazepam (ATIVAN) injection 1 mg (1 mg Intramuscular Given 02/11/19 1403)  lisinopril (ZESTRIL) tablet 40 mg (40 mg Oral Given 02/11/19 1400)     Initial Impression / Assessment and Plan / ED Course  I have reviewed the triage vital signs and the nursing notes.  Pertinent labs & imaging results that were available during my care of the patient were reviewed by me and considered in my medical decision making (see chart for details).  Clinical Course as of Feb 11 1408  Sat Feb 11, 2019  1257 Patient here for refill of his  occasion.  Patient ran out of his Xanax yesterday.  He received a prescription for 30 days on 3 September.  Patient crying, anxious.  He reports hallucinations.  He does not appear to be in withdrawal.  I am ordering him a milligram of IM Ativan and getting medical clearance.  Patient is voluntary and would like to talk to the psych team. Patient medically cleared for psych   [KM]  1408 Patient cleared by psych.  We recommend giving him 1 medication to get him to his primary care visit.  I also temporarily refilled his diabetes injections.  Patient stable for discharge at this time.   [KM]    Clinical Course User Index [KM] Alveria Apley, PA-C       Based on review of vitals, medical screening exam, lab work and/or imaging, there does not appear to be an acute, emergent etiology for the patient's symptoms. Counseled pt on good return precautions and encouraged both PCP and ED follow-up as needed.  Prior to discharge, I also discussed incidental imaging findings with patient in detail and advised appropriate, recommended follow-up in detail.  Clinical Impression: 1. Anxiety     Disposition: Discharge  Prior to providing a prescription for  a controlled substance, I independently reviewed the patient's recent prescription history on the [place]. The patient had no recent or regular prescriptions and was deemed appropriate for a brief, less than 3 day prescription of narcotic for acute analgesia.  This note was prepared with assistance of Systems analyst. Occasional wrong-word or sound-a-like substitutions may have occurred due to the inherent limitations of voice recognition software.   Final Clinical Impressions(s) / ED Diagnoses   Final diagnoses:  Anxiety    ED Discharge Orders         Ordered    ALPRAZolam (XANAX) 1 MG tablet  3 times daily     02/11/19 1408    Dulaglutide (TRULICITY) 1.5 0000000 SOPN  Every 7 days     02/11/19 1408    Insulin Degludec (TRESIBA) 100 UNIT/ML SOLN  Daily     02/11/19 1408           Kristine Royal 02/11/19 1409    Charlesetta Shanks, MD 02/12/19 1443

## 2019-02-13 ENCOUNTER — Ambulatory Visit: Payer: Medicare Other | Admitting: Physical Therapy

## 2019-02-15 ENCOUNTER — Ambulatory Visit: Payer: Medicare Other | Admitting: Physical Therapy

## 2019-02-20 ENCOUNTER — Encounter: Payer: Medicare Other | Admitting: Physical Therapy

## 2019-02-22 ENCOUNTER — Ambulatory Visit: Payer: Medicare Other | Admitting: Physical Therapy

## 2019-05-25 ENCOUNTER — Encounter (HOSPITAL_COMMUNITY): Payer: Self-pay | Admitting: Emergency Medicine

## 2019-05-25 ENCOUNTER — Emergency Department (HOSPITAL_COMMUNITY): Payer: Medicare HMO

## 2019-05-25 ENCOUNTER — Emergency Department (HOSPITAL_COMMUNITY)
Admission: EM | Admit: 2019-05-25 | Discharge: 2019-05-25 | Disposition: A | Discharge status: Home / Self Care | Payer: Medicare HMO | Attending: Emergency Medicine | Admitting: Emergency Medicine

## 2019-05-25 DIAGNOSIS — W19XXXA Unspecified fall, initial encounter: Secondary | ICD-10-CM

## 2019-05-25 DIAGNOSIS — F1210 Cannabis abuse, uncomplicated: Secondary | ICD-10-CM | POA: Diagnosis not present

## 2019-05-25 DIAGNOSIS — E119 Type 2 diabetes mellitus without complications: Secondary | ICD-10-CM | POA: Insufficient documentation

## 2019-05-25 DIAGNOSIS — Y939 Activity, unspecified: Secondary | ICD-10-CM | POA: Diagnosis not present

## 2019-05-25 DIAGNOSIS — F17210 Nicotine dependence, cigarettes, uncomplicated: Secondary | ICD-10-CM | POA: Diagnosis not present

## 2019-05-25 DIAGNOSIS — Y929 Unspecified place or not applicable: Secondary | ICD-10-CM | POA: Diagnosis not present

## 2019-05-25 DIAGNOSIS — Z79899 Other long term (current) drug therapy: Secondary | ICD-10-CM | POA: Insufficient documentation

## 2019-05-25 DIAGNOSIS — S299XXA Unspecified injury of thorax, initial encounter: Secondary | ICD-10-CM

## 2019-05-25 DIAGNOSIS — Y999 Unspecified external cause status: Secondary | ICD-10-CM | POA: Insufficient documentation

## 2019-05-25 DIAGNOSIS — I10 Essential (primary) hypertension: Secondary | ICD-10-CM | POA: Insufficient documentation

## 2019-05-25 DIAGNOSIS — J449 Chronic obstructive pulmonary disease, unspecified: Secondary | ICD-10-CM | POA: Diagnosis not present

## 2019-05-25 DIAGNOSIS — W109XXA Fall (on) (from) unspecified stairs and steps, initial encounter: Secondary | ICD-10-CM | POA: Insufficient documentation

## 2019-05-25 DIAGNOSIS — Z7984 Long term (current) use of oral hypoglycemic drugs: Secondary | ICD-10-CM | POA: Insufficient documentation

## 2019-05-25 MED ORDER — TRAMADOL HCL 50 MG PO TABS: 50.0000 mg | ORAL_TABLET | Freq: Four times a day (QID) | ORAL | 0 refills | Status: AC | PRN

## 2019-05-25 MED ORDER — IBUPROFEN 800 MG PO TABS: 800.0000 mg | ORAL_TABLET | Freq: Three times a day (TID) | ORAL | 0 refills | Status: AC | PRN

## 2019-05-25 NOTE — ED Triage Notes (Signed)
Pt states 2 days ago he was walking up 2 wooden steps and tripped and fell onto left rib cage and since has been having pain in his ribs and worse with a deep breath. Pt denies any sob.

## 2019-05-25 NOTE — Discharge Instructions (Addendum)
Your x-rays did not show any broken bones but this does not mean that you do not have a broken rib.  You can have broken ribs that do not show up on x-rays initially.  Return here as needed.  He is ice over the area that is sore.

## 2019-05-25 NOTE — ED Notes (Signed)
Registration called for pt x2, no response.

## 2019-05-25 NOTE — ED Provider Notes (Signed)
[facility] EMERGENCY DEPARTMENT Provider Note   CSN: [id number] Arrival date & time: 05/25/19  1336     History Chief Complaint  Patient presents with  . Fall    Douglas Melendez is a 64 y.o. male.  HPI Patient presents to the emergency department with left-sided rib pain that started after a fall 2 days ago.  The patient states he attempted to go to an urgent care but they did not see him so he went home and felt like the pain may improve.  The patient states the pain has not improved and certain movements and palpation make the pain worse.  Patient states he did not take any medications prior to arrival for his symptoms.  Patient states he did not injure himself in any other way other than hitting his ribs against the step.  Patient denies nausea, vomiting, shortness of breath, weakness, dizziness, back pain, neck pain, headache, near-syncope or syncope.    Past Medical History:  Diagnosis Date  . Diabetes mellitus without complication ([place])   . Hypertension     Patient Active Problem List   Diagnosis Date Noted  . 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 Lumbar disc disease with radiculopathy 10/21/2018  . Nausea and vomiting 09/21/2018  . Hypertension associated with diabetes ([place]) 09/21/2018  . Diabetes mellitus without complication ([place]) 0000000  . Generalized anxiety disorder 09/21/2018  . Opioid use disorder ([place]) 09/21/2018  . Tobacco use 09/21/2018  . Chronic pain of both knees 07/04/2018  . Closed nondisplaced fracture of right patella 03/31/2018  . BMI 31.0-31.9,adult 10/21/2016  . 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 Severe single current episode of major depressive disorder, without psychotic features ([place]) 05/26/2016  . Chronic obstructive pulmonary disease, unspecified ([place]) 05/26/2016  . Baker's cyst of knee, left 05/26/2016  . 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 Anxiety 03/22/2016    No past  surgical history on file.     Family History  Problem Relation Age of Onset  . Diabetes Mother   . Hypertension Mother   . Diabetes Father   . Hypertension Father     Social History   Tobacco Use  . Smoking status: Current Every Day Smoker    Packs/day: 2.00    Types: Cigarettes  . Smokeless tobacco: Never Used  Substance Use Topics  . Alcohol use: No  . Drug use: Yes    Types: Marijuana    Comment: occasional marijuana    Home Medications Prior to Admission medications   Medication Sig Start Date End Date Taking? Authorizing Provider  albuterol (PROVENTIL HFA;VENTOLIN HFA) 108 (90 Base) MCG/ACT inhaler Inhale 2 puffs into the lungs every 6 (six) hours as needed for wheezing or shortness of breath.    [provider]  amLODipine (NORVASC) 10 MG tablet Take 1 tablet (10 mg total) by mouth daily for 14 days. 09/21/18 10/05/18  Doneta Public, MD  buprenorphine-naloxone (SUBOXONE) 8-2 MG SUBL SL tablet Place 1 tablet under the tongue 3 (three) times daily.    [provider]  cloNIDine (CATAPRES) 0.1 MG tablet Take by mouth. 10/26/18   [provider]  glucose blood (ACCU-CHEK ACTIVE STRIPS) test strip Use as instructed 06/16/17   Hedges, Dellis Filbert, PA-C  lisinopril (ZESTRIL) 40 MG tablet Take 1 tablet (40 mg total) by mouth daily. 09/21/18 09/21/19  Doneta Public, MD  metFORMIN (GLUCOPHAGE) 1000 MG tablet Take 1,000 mg by mouth 2 (two) times daily with a meal.  01/27/16  09/22/18  [provider]    Allergies    Patient has no known allergies.  Review of Systems   Review of Systems All other systems negative except as documented in the HPI. All pertinent positives and negatives as reviewed in the HPI. Physical Exam Updated Vital Signs BP (!) 163/106 (BP Location: Right Arm)   Pulse 93   Temp 98.3 F (36.8 C) (Oral)   Resp 16   SpO2 98%   Physical Exam Vitals and nursing note reviewed.  Constitutional:      General: He is not in acute distress.     Appearance: He is well-developed.  HENT:     Head: Normocephalic and atraumatic.  Eyes:     Pupils: Pupils are equal, round, and reactive to light.  Cardiovascular:     Rate and Rhythm: Normal rate and regular rhythm.  Pulmonary:     Effort: Pulmonary effort is normal. No respiratory distress.     Breath sounds: No stridor. No wheezing or rhonchi.  Chest:     Chest wall: Tenderness present.  Skin:    General: Skin is warm and dry.  Neurological:     Mental Status: He is alert and oriented to person, place, and time.     ED Results / Procedures / Treatments   Labs (all labs ordered are listed, but only abnormal results are displayed) Labs Reviewed - No data to display  EKG None  Radiology DG Ribs Unilateral W/Chest Left  Result Date: 05/25/2019 CLINICAL DATA:  Rib pain after fall 2 days ago EXAM: LEFT RIBS AND CHEST - 3+ VIEW COMPARISON:  Chest x-ray 10/12/2018 FINDINGS: No acute displaced rib fracture or other bone lesions are seen involving the ribs. There is no evidence of pneumothorax or pleural effusion. Both lungs are clear. Aortic atherosclerosis. Heart size and mediastinal contours are within normal limits. IMPRESSION: Negative. Electronically Signed   By: Davina Poke D.O.   On: 05/25/2019 14:37    Procedures Procedures (including critical care time)  Medications Ordered in ED Medications - No data to display  ED Course  I have reviewed the triage vital signs and the nursing notes.  Pertinent labs & imaging results that were available during my care of the patient were reviewed by me and considered in my medical decision making (see chart for details).    MDM Rules/Calculators/A&P                   Patient has a rib injury and will treat for this.  Patient is advised to use ice over the area.  Told to return here as needed.  Told to follow-up with his primary doctor.  Patient agrees the plan and all questions were answered.  Patient vital signs remained  stable here in the emergency department. Final Clinical Impression(s) / ED Diagnoses Final diagnoses:  None    Rx / DC Orders ED Discharge Orders    None       Dalia Heading, PA-C 05/25/19 1711    Maudie Flakes, MD 05/26/19 [phone]

## 2019-12-19 ENCOUNTER — Other Ambulatory Visit: Payer: Self-pay

## 2019-12-19 ENCOUNTER — Emergency Department (HOSPITAL_COMMUNITY)
Admission: EM | Admit: 2019-12-19 | Discharge: 2019-12-19 | Disposition: A | Discharge status: Home / Self Care | Payer: Medicare HMO | Attending: Emergency Medicine | Admitting: Emergency Medicine

## 2019-12-19 ENCOUNTER — Encounter (HOSPITAL_COMMUNITY): Payer: Self-pay | Admitting: Emergency Medicine

## 2019-12-19 DIAGNOSIS — R112 Nausea with vomiting, unspecified: Secondary | ICD-10-CM | POA: Diagnosis not present

## 2019-12-19 DIAGNOSIS — F13239 Sedative, hypnotic or anxiolytic dependence with withdrawal, unspecified: Secondary | ICD-10-CM | POA: Diagnosis present

## 2019-12-19 DIAGNOSIS — Z5321 Procedure and treatment not carried out due to patient leaving prior to being seen by health care provider: Secondary | ICD-10-CM | POA: Insufficient documentation

## 2019-12-19 DIAGNOSIS — R109 Unspecified abdominal pain: Secondary | ICD-10-CM | POA: Diagnosis not present

## 2019-12-19 LAB — CBC WITH DIFFERENTIAL/PLATELET
Abs Immature Granulocytes: 0.08 10*3/uL — ABNORMAL HIGH (ref 0.00–0.07)
Basophils Absolute: 0.1 10*3/uL (ref 0.0–0.1)
Basophils Relative: 1 %
Eosinophils Absolute: 0.1 10*3/uL (ref 0.0–0.5)
Eosinophils Relative: 1 %
HCT: 45.3 % (ref 39.0–52.0)
Hemoglobin: 14.8 g/dL (ref 13.0–17.0)
Immature Granulocytes: 1 %
Lymphocytes Relative: 16 %
Lymphs Abs: 2 10*3/uL (ref 0.7–4.0)
MCH: 29.7 pg (ref 26.0–34.0)
MCHC: 32.7 g/dL (ref 30.0–36.0)
MCV: 91 fL (ref 80.0–100.0)
Monocytes Absolute: 0.8 10*3/uL (ref 0.1–1.0)
Monocytes Relative: 7 %
Neutro Abs: 9.3 10*3/uL — ABNORMAL HIGH (ref 1.7–7.7)
Neutrophils Relative %: 74 %
Platelets: 359 10*3/uL (ref 150–400)
RBC: 4.98 MIL/uL (ref 4.22–5.81)
RDW: 13.9 % (ref 11.5–15.5)
WBC: 12.4 10*3/uL — ABNORMAL HIGH (ref 4.0–10.5)
nRBC: 0 % (ref 0.0–0.2)

## 2019-12-19 LAB — COMPREHENSIVE METABOLIC PANEL
ALT: 15 U/L (ref 0–44)
AST: 37 U/L (ref 15–41)
Albumin: 3.9 g/dL (ref 3.5–5.0)
Alkaline Phosphatase: 96 U/L (ref 38–126)
Anion gap: 12 (ref 5–15)
BUN: 10 mg/dL (ref 8–23)
CO2: 25 mmol/L (ref 22–32)
Calcium: 9.2 mg/dL (ref 8.9–10.3)
Chloride: 100 mmol/L (ref 98–111)
Creatinine, Ser: 0.88 mg/dL (ref 0.61–1.24)
GFR calc Af Amer: >60 mL/min (ref >60)
GFR calc non Af Amer: >60 mL/min (ref >60)
Glucose, Bld: 161 mg/dL — ABNORMAL HIGH (ref 70–99)
Potassium: 3.2 mmol/L — ABNORMAL LOW (ref 3.5–5.1)
Sodium: 137 mmol/L (ref 135–145)
Total Bilirubin: 0.8 mg/dL (ref 0.3–1.2)
Total Protein: 8.1 g/dL (ref 6.5–8.1)

## 2019-12-19 NOTE — ED Notes (Signed)
Pt stated he was leaving AMA and would potentally try again in the morning
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
## 2021-04-11 ENCOUNTER — Emergency Department (HOSPITAL_COMMUNITY): Payer: Medicare Other

## 2021-04-11 ENCOUNTER — Inpatient Hospital Stay (HOSPITAL_COMMUNITY)
Admission: EM | Admit: 2021-04-11 | Discharge: 2021-04-17 | DRG: 957 | Disposition: E | Payer: Medicare Other | Attending: Surgery | Admitting: Surgery

## 2021-04-11 ENCOUNTER — Encounter (HOSPITAL_COMMUNITY): Admission: EM | Disposition: E | Payer: Self-pay | Source: Home / Self Care

## 2021-04-11 ENCOUNTER — Encounter (HOSPITAL_COMMUNITY): Payer: Self-pay

## 2021-04-11 ENCOUNTER — Emergency Department (HOSPITAL_COMMUNITY): Payer: Medicare Other | Admitting: Anesthesiology

## 2021-04-11 DIAGNOSIS — S27329A Contusion of lung, unspecified, initial encounter: Secondary | ICD-10-CM | POA: Diagnosis present

## 2021-04-11 DIAGNOSIS — E1165 Type 2 diabetes mellitus with hyperglycemia: Secondary | ICD-10-CM | POA: Diagnosis not present

## 2021-04-11 DIAGNOSIS — I959 Hypotension, unspecified: Secondary | ICD-10-CM | POA: Diagnosis present

## 2021-04-11 DIAGNOSIS — Z8249 Family history of ischemic heart disease and other diseases of the circulatory system: Secondary | ICD-10-CM

## 2021-04-11 DIAGNOSIS — S42301A Unspecified fracture of shaft of humerus, right arm, initial encounter for closed fracture: Secondary | ICD-10-CM | POA: Diagnosis present

## 2021-04-11 DIAGNOSIS — R402212 Coma scale, best verbal response, none, at arrival to emergency department: Secondary | ICD-10-CM | POA: Diagnosis present

## 2021-04-11 DIAGNOSIS — R402112 Coma scale, eyes open, never, at arrival to emergency department: Secondary | ICD-10-CM | POA: Diagnosis present

## 2021-04-11 DIAGNOSIS — Z23 Encounter for immunization: Secondary | ICD-10-CM | POA: Diagnosis present

## 2021-04-11 DIAGNOSIS — S0101XA Laceration without foreign body of scalp, initial encounter: Secondary | ICD-10-CM | POA: Diagnosis present

## 2021-04-11 DIAGNOSIS — Z66 Do not resuscitate: Secondary | ICD-10-CM | POA: Diagnosis present

## 2021-04-11 DIAGNOSIS — T794XXA Traumatic shock, initial encounter: Secondary | ICD-10-CM | POA: Diagnosis present

## 2021-04-11 DIAGNOSIS — J449 Chronic obstructive pulmonary disease, unspecified: Secondary | ICD-10-CM | POA: Diagnosis present

## 2021-04-11 DIAGNOSIS — Z833 Family history of diabetes mellitus: Secondary | ICD-10-CM | POA: Diagnosis not present

## 2021-04-11 DIAGNOSIS — S22089A Unspecified fracture of T11-T12 vertebra, initial encounter for closed fracture: Secondary | ICD-10-CM | POA: Diagnosis present

## 2021-04-11 DIAGNOSIS — E872 Acidosis, unspecified: Secondary | ICD-10-CM | POA: Diagnosis present

## 2021-04-11 DIAGNOSIS — S270XXA Traumatic pneumothorax, initial encounter: Secondary | ICD-10-CM | POA: Diagnosis present

## 2021-04-11 DIAGNOSIS — S2243XA Multiple fractures of ribs, bilateral, initial encounter for closed fracture: Secondary | ICD-10-CM | POA: Diagnosis present

## 2021-04-11 DIAGNOSIS — D62 Acute posthemorrhagic anemia: Secondary | ICD-10-CM | POA: Diagnosis present

## 2021-04-11 DIAGNOSIS — J939 Pneumothorax, unspecified: Secondary | ICD-10-CM

## 2021-04-11 DIAGNOSIS — S82462B Displaced segmental fracture of shaft of left fibula, initial encounter for open fracture type I or II: Secondary | ICD-10-CM | POA: Diagnosis present

## 2021-04-11 DIAGNOSIS — S82201B Unspecified fracture of shaft of right tibia, initial encounter for open fracture type I or II: Secondary | ICD-10-CM | POA: Diagnosis present

## 2021-04-11 DIAGNOSIS — Y92410 Unspecified street and highway as the place of occurrence of the external cause: Secondary | ICD-10-CM

## 2021-04-11 DIAGNOSIS — S27321A Contusion of lung, unilateral, initial encounter: Secondary | ICD-10-CM | POA: Diagnosis present

## 2021-04-11 DIAGNOSIS — S36899A Unspecified injury of other intra-abdominal organs, initial encounter: Secondary | ICD-10-CM | POA: Diagnosis present

## 2021-04-11 DIAGNOSIS — Z20822 Contact with and (suspected) exposure to covid-19: Secondary | ICD-10-CM | POA: Diagnosis present

## 2021-04-11 DIAGNOSIS — T07XXXA Unspecified multiple injuries, initial encounter: Secondary | ICD-10-CM | POA: Diagnosis present

## 2021-04-11 DIAGNOSIS — S32018A Other fracture of first lumbar vertebra, initial encounter for closed fracture: Secondary | ICD-10-CM | POA: Diagnosis present

## 2021-04-11 DIAGNOSIS — Z5900 Homelessness unspecified: Secondary | ICD-10-CM | POA: Diagnosis not present

## 2021-04-11 DIAGNOSIS — S2220XA Unspecified fracture of sternum, initial encounter for closed fracture: Secondary | ICD-10-CM | POA: Diagnosis present

## 2021-04-11 DIAGNOSIS — T1490XA Injury, unspecified, initial encounter: Secondary | ICD-10-CM

## 2021-04-11 DIAGNOSIS — I10 Essential (primary) hypertension: Secondary | ICD-10-CM | POA: Diagnosis present

## 2021-04-11 DIAGNOSIS — S82401B Unspecified fracture of shaft of right fibula, initial encounter for open fracture type I or II: Secondary | ICD-10-CM | POA: Diagnosis present

## 2021-04-11 DIAGNOSIS — R402312 Coma scale, best motor response, none, at arrival to emergency department: Secondary | ICD-10-CM | POA: Diagnosis present

## 2021-04-11 DIAGNOSIS — F17210 Nicotine dependence, cigarettes, uncomplicated: Secondary | ICD-10-CM | POA: Diagnosis present

## 2021-04-11 HISTORY — DX: Chronic obstructive pulmonary disease, unspecified: J44.9

## 2021-04-11 HISTORY — PX: LAPAROTOMY: SHX154

## 2021-04-11 HISTORY — DX: Essential (primary) hypertension: I10

## 2021-04-11 HISTORY — DX: Type 2 diabetes mellitus without complications: E11.9

## 2021-04-11 LAB — COMPREHENSIVE METABOLIC PANEL
ALT: 31 U/L (ref 0–44)
AST: 63 U/L — ABNORMAL HIGH (ref 15–41)
Albumin: 3.1 g/dL — ABNORMAL LOW (ref 3.5–5.0)
Alkaline Phosphatase: 70 U/L (ref 38–126)
Anion gap: 12 (ref 5–15)
BUN: 13 mg/dL (ref 8–23)
CO2: 21 mmol/L — ABNORMAL LOW (ref 22–32)
Calcium: 8 mg/dL — ABNORMAL LOW (ref 8.9–10.3)
Chloride: 103 mmol/L (ref 98–111)
Creatinine, Ser: 1.43 mg/dL — ABNORMAL HIGH (ref 0.61–1.24)
GFR, Estimated: 54 mL/min — ABNORMAL LOW (ref >60)
Glucose, Bld: 179 mg/dL — ABNORMAL HIGH (ref 70–99)
Potassium: 4.1 mmol/L (ref 3.5–5.1)
Sodium: 136 mmol/L (ref 135–145)
Total Bilirubin: 0.4 mg/dL (ref 0.3–1.2)
Total Protein: 6 g/dL — ABNORMAL LOW (ref 6.5–8.1)

## 2021-04-11 LAB — RESP PANEL BY RT-PCR (FLU A&B, COVID) ARPGX2
Influenza A by PCR: NEGATIVE
Influenza B by PCR: NEGATIVE
SARS Coronavirus 2 by RT PCR: NEGATIVE

## 2021-04-11 LAB — I-STAT ARTERIAL BLOOD GAS, ED
Acid-base deficit: 8 mmol/L — ABNORMAL HIGH (ref 0.0–2.0)
Bicarbonate: 18.7 mmol/L — ABNORMAL LOW (ref 20.0–28.0)
Calcium, Ion: 0.8 mmol/L — CL (ref 1.15–1.40)
HCT: 42 % (ref 39.0–52.0)
Hemoglobin: 14.3 g/dL (ref 13.0–17.0)
O2 Saturation: 90 %
Potassium: 4.5 mmol/L (ref 3.5–5.1)
Sodium: 137 mmol/L (ref 135–145)
TCO2: 20 mmol/L — ABNORMAL LOW (ref 22–32)
pCO2 arterial: 40.2 mmHg (ref 32.0–48.0)
pH, Arterial: 7.275 — ABNORMAL LOW (ref 7.350–7.450)
pO2, Arterial: 67 mmHg — ABNORMAL LOW (ref 83.0–108.0)

## 2021-04-11 LAB — URINALYSIS, ROUTINE W REFLEX MICROSCOPIC
Bacteria, UA: NONE SEEN
Bilirubin Urine: NEGATIVE
Glucose, UA: 50 mg/dL — AB
Ketones, ur: NEGATIVE mg/dL
Leukocytes,Ua: NEGATIVE
Nitrite: NEGATIVE
Protein, ur: NEGATIVE mg/dL
Specific Gravity, Urine: 1.018 (ref 1.005–1.030)
pH: 5 (ref 5.0–8.0)

## 2021-04-11 LAB — I-STAT CHEM 8, ED
BUN: 13 mg/dL (ref 8–23)
Calcium, Ion: 0.97 mmol/L — ABNORMAL LOW (ref 1.15–1.40)
Chloride: 102 mmol/L (ref 98–111)
Creatinine, Ser: 1.4 mg/dL — ABNORMAL HIGH (ref 0.61–1.24)
Glucose, Bld: 178 mg/dL — ABNORMAL HIGH (ref 70–99)
HCT: 32 % — ABNORMAL LOW (ref 39.0–52.0)
Hemoglobin: 10.9 g/dL — ABNORMAL LOW (ref 13.0–17.0)
Potassium: 3.8 mmol/L (ref 3.5–5.1)
Sodium: 138 mmol/L (ref 135–145)
TCO2: 21 mmol/L — ABNORMAL LOW (ref 22–32)

## 2021-04-11 LAB — MASSIVE TRANSFUSION PROTOCOL ORDER (BLOOD BANK NOTIFICATION)

## 2021-04-11 LAB — PROTIME-INR
INR: 1.1 (ref 0.8–1.2)
Prothrombin Time: 14.4 seconds (ref 11.4–15.2)

## 2021-04-11 LAB — ABO/RH: ABO/RH(D): A NEG

## 2021-04-11 SURGERY — LAPAROTOMY, EXPLORATORY
Anesthesia: General | Site: Abdomen

## 2021-04-11 MED ORDER — FENTANYL 2500MCG IN NS 250ML (10MCG/ML) PREMIX INFUSION
25.0000 ug/h | INTRAVENOUS | Status: DC
Start: 2021-04-11 — End: 2021-04-12
  Administered 2021-04-11: 50 ug/h via INTRAVENOUS
  Filled 2021-04-11: qty 250

## 2021-04-11 MED ORDER — ETOMIDATE 2 MG/ML IV SOLN
INTRAVENOUS | Status: AC | PRN
  Administered 2021-04-11: 20 mg via INTRAVENOUS

## 2021-04-11 MED ORDER — FENTANYL CITRATE (PF) 250 MCG/5ML IJ SOLN
INTRAMUSCULAR | Status: AC
  Filled 2021-04-11: qty 5

## 2021-04-11 MED ORDER — TETANUS-DIPHTH-ACELL PERTUSSIS 5-2.5-18.5 LF-MCG/0.5 IM SUSY
0.5000 mL | PREFILLED_SYRINGE | Freq: Once | INTRAMUSCULAR | Status: AC
  Administered 2021-04-11: 0.5 mL via INTRAMUSCULAR

## 2021-04-11 MED ORDER — 0.9 % SODIUM CHLORIDE (POUR BTL) OPTIME
TOPICAL | Status: DC | PRN
  Administered 2021-04-11: 5000 mL

## 2021-04-11 MED ORDER — FENTANYL BOLUS VIA INFUSION
25.0000 ug | INTRAVENOUS | Status: DC | PRN
  Filled 2021-04-11: qty 100

## 2021-04-11 MED ORDER — CALCIUM GLUCONATE-NACL 1-0.675 GM/50ML-% IV SOLN
1.0000 g | Freq: Once | INTRAVENOUS | Status: AC
  Administered 2021-04-11: 1000 mg via INTRAVENOUS

## 2021-04-11 MED ORDER — CEFAZOLIN SODIUM-DEXTROSE 2-4 GM/100ML-% IV SOLN
2.0000 g | Freq: Three times a day (TID) | INTRAVENOUS | Status: DC
  Administered 2021-04-12 (×2): 2 g via INTRAVENOUS
  Filled 2021-04-11 (×3): qty 100

## 2021-04-11 MED ORDER — PHENYLEPHRINE HCL-NACL 20-0.9 MG/250ML-% IV SOLN
INTRAVENOUS | Status: DC | PRN
  Administered 2021-04-11: 30 ug/min via INTRAVENOUS

## 2021-04-11 MED ORDER — CALCIUM CHLORIDE 10 % IV SOLN
INTRAVENOUS | Status: DC | PRN
  Administered 2021-04-11: 1 g via INTRAVENOUS

## 2021-04-11 MED ORDER — FENTANYL CITRATE PF 50 MCG/ML IJ SOSY
PREFILLED_SYRINGE | INTRAMUSCULAR | Status: AC | PRN
  Administered 2021-04-11: 100 ug via INTRAVENOUS

## 2021-04-11 MED ORDER — FENTANYL CITRATE (PF) 100 MCG/2ML IJ SOLN
INTRAMUSCULAR | Status: AC
  Filled 2021-04-11: qty 2

## 2021-04-11 MED ORDER — SODIUM BICARBONATE 8.4 % IV SOLN
INTRAVENOUS | Status: DC | PRN
  Administered 2021-04-11: 50 meq via INTRAVENOUS

## 2021-04-11 MED ORDER — CEFAZOLIN SODIUM-DEXTROSE 2-4 GM/100ML-% IV SOLN
2.0000 g | Freq: Once | INTRAVENOUS | Status: AC
  Administered 2021-04-11: 2 g via INTRAVENOUS

## 2021-04-11 MED ORDER — PROPOFOL 1000 MG/100ML IV EMUL
INTRAVENOUS | Status: AC
  Filled 2021-04-11: qty 100

## 2021-04-11 MED ORDER — FENTANYL CITRATE PF 50 MCG/ML IJ SOSY
25.0000 ug | PREFILLED_SYRINGE | Freq: Once | INTRAMUSCULAR | Status: AC
  Administered 2021-04-11: 100 ug via INTRAVENOUS

## 2021-04-11 MED ORDER — ROCURONIUM 10MG/ML (10ML) SYRINGE FOR MEDFUSION PUMP - OPTIME
INTRAVENOUS | Status: DC | PRN
  Administered 2021-04-11: 20 mg via INTRAVENOUS
  Administered 2021-04-11: 80 mg via INTRAVENOUS

## 2021-04-11 MED ORDER — LACTATED RINGERS IV SOLN: INTRAVENOUS | Status: DC | PRN

## 2021-04-11 MED ORDER — CALCIUM CHLORIDE 10 % IV SOLN
INTRAVENOUS | Status: AC
  Filled 2021-04-11: qty 10

## 2021-04-11 MED ORDER — IOHEXOL 350 MG/ML SOLN
100.0000 mL | Freq: Once | INTRAVENOUS | Status: AC | PRN
  Administered 2021-04-11: 100 mL via INTRAVENOUS

## 2021-04-11 MED ORDER — SUCCINYLCHOLINE CHLORIDE 20 MG/ML IJ SOLN
INTRAMUSCULAR | Status: AC | PRN
  Administered 2021-04-11: 100 mg via INTRAVENOUS

## 2021-04-11 MED ORDER — SODIUM BICARBONATE 8.4 % IV SOLN
INTRAVENOUS | Status: AC
  Filled 2021-04-11: qty 50

## 2021-04-11 MED ORDER — FENTANYL 50 MCG/ML (10ML) SYRINGE FOR MEDFUSION PUMP - OPTIME
INTRAMUSCULAR | Status: DC | PRN
  Administered 2021-04-11: 100 ug via INTRAVENOUS

## 2021-04-11 SURGICAL SUPPLY — 48 items
APL PRP STRL LF DISP 70% ISPRP (MISCELLANEOUS) ×1
BLADE CLIPPER SURG (BLADE) IMPLANT
BNDG COHESIVE 6X5 TAN ST LF (GAUZE/BANDAGES/DRESSINGS) ×2 IMPLANT
CANISTER SUCT 3000ML PPV (MISCELLANEOUS) ×3 IMPLANT
CHLORAPREP W/TINT 26 (MISCELLANEOUS) ×3 IMPLANT
COVER SURGICAL LIGHT HANDLE (MISCELLANEOUS) ×3 IMPLANT
DRAPE LAPAROSCOPIC ABDOMINAL (DRAPES) ×3 IMPLANT
DRAPE WARM FLUID 44X44 (DRAPES) ×3 IMPLANT
DRSG OPSITE POSTOP 4X10 (GAUZE/BANDAGES/DRESSINGS) IMPLANT
DRSG OPSITE POSTOP 4X12 (GAUZE/BANDAGES/DRESSINGS) ×2 IMPLANT
DRSG OPSITE POSTOP 4X6 (GAUZE/BANDAGES/DRESSINGS) ×2 IMPLANT
DRSG OPSITE POSTOP 4X8 (GAUZE/BANDAGES/DRESSINGS) IMPLANT
ELECT BLADE 6.5 EXT (BLADE) IMPLANT
ELECT CAUTERY BLADE 6.4 (BLADE) ×3 IMPLANT
ELECT REM PT RETURN 9FT ADLT (ELECTROSURGICAL) ×3
ELECTRODE REM PT RTRN 9FT ADLT (ELECTROSURGICAL) ×1 IMPLANT
GLOVE SRG 8 PF TXTR STRL LF DI (GLOVE) ×1 IMPLANT
GLOVE SURG ENC MOIS LTX SZ7.5 (GLOVE) ×3 IMPLANT
GLOVE SURG ORTHO LTX SZ8 (GLOVE) ×4 IMPLANT
GLOVE SURG UNDER POLY LF SZ8 (GLOVE) ×3
GOWN STRL REUS W/ TWL LRG LVL3 (GOWN DISPOSABLE) ×1 IMPLANT
GOWN STRL REUS W/ TWL XL LVL3 (GOWN DISPOSABLE) ×1 IMPLANT
GOWN STRL REUS W/TWL LRG LVL3 (GOWN DISPOSABLE) ×3
GOWN STRL REUS W/TWL XL LVL3 (GOWN DISPOSABLE) ×3
HANDLE SUCTION POOLE (INSTRUMENTS) ×1 IMPLANT
KIT BASIN OR (CUSTOM PROCEDURE TRAY) ×3 IMPLANT
KIT TURNOVER KIT B (KITS) ×3 IMPLANT
LIGASURE IMPACT 36 18CM CVD LR (INSTRUMENTS) ×2 IMPLANT
NS IRRIG 1000ML POUR BTL (IV SOLUTION) ×12 IMPLANT
PACK GENERAL/GYN (CUSTOM PROCEDURE TRAY) ×3 IMPLANT
PAD ARMBOARD 7.5X6 YLW CONV (MISCELLANEOUS) ×3 IMPLANT
PENCIL SMOKE EVACUATOR (MISCELLANEOUS) ×3 IMPLANT
SPECIMEN JAR LARGE (MISCELLANEOUS) IMPLANT
SPONGE T-LAP 18X18 ~~LOC~~+RFID (SPONGE) IMPLANT
STAPLER VISISTAT 35W (STAPLE) ×3 IMPLANT
SUCTION POOLE HANDLE (INSTRUMENTS) ×3
SUT PDS AB 1 TP1 54 (SUTURE) ×6 IMPLANT
SUT PDS AB 2-0 CT1 27 (SUTURE) ×6 IMPLANT
SUT SILK 2 0 SH CR/8 (SUTURE) ×3 IMPLANT
SUT SILK 2 0 TIES 10X30 (SUTURE) ×3 IMPLANT
SUT SILK 3 0 SH CR/8 (SUTURE) ×3 IMPLANT
SUT SILK 3 0 TIES 10X30 (SUTURE) ×3 IMPLANT
SUT VIC AB 2-0 SH 18 (SUTURE) ×2 IMPLANT
TOWEL GREEN STERILE (TOWEL DISPOSABLE) ×3 IMPLANT
TOWEL OR NON WOVEN STRL DISP B (DISPOSABLE) IMPLANT
TOWEL ~~LOC~~+RFID 17X26 BLUE (SPONGE) ×2 IMPLANT
TRAY FOLEY MTR SLVR 16FR STAT (SET/KITS/TRAYS/PACK) ×1 IMPLANT
YANKAUER SUCT BULB TIP NO VENT (SUCTIONS) IMPLANT
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
## 2021-04-11 NOTE — Progress Notes (Signed)
Orthopedic Tech Progress Note Patient Details:  Douglas Melendez 06/01/1955 [id number]  Coaptation splint applied to RUE after pt returned from CT.  Ortho Devices Type of Ortho Device: Cotton web roll, Coapt Ortho Device/Splint Location: RUE Ortho Device/Splint Interventions: Ordered, Application   Post Interventions Patient Tolerated: Well, Other (comment) (pt intubated) Instructions Provided: Other (comment) (pt intubated)  [name] 03/18/2021, 10:01 PM

## 2021-04-11 NOTE — Consult Note (Signed)
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 Multidetector CT imaging of the chest was performed using the standard protocol during bolus administration of intravenous contrast. Multiplanar CT image reconstructions and MIPs were obtained to evaluate the vascular anatomy. Multidetector CT imaging of the abdomen and pelvis was performed using the standard protocol during bolus administration of intravenous contrast. CONTRAST:  100 cc Omnipaque 300 IV COMPARISON:  09/28/2018 FINDINGS: CTA CHEST FINDINGS Cardiovascular: Atherosclerotic calcifications aorta, coronary arteries, proximal great vessels. Tortuous RIGHT brachiocephalic artery. Mild aneurysmal dilatation of the ascending thoracic aorta without dissection or para-aortic hemorrhage. Central pulmonary arteries grossly patent on non targeted exam. Heart unremarkable. No pericardial effusion. Mediastinum/Nodes: Esophagus unremarkable. Base of cervical region  normal appearance. Minimal retrosternal hematoma associated with overlying nondisplaced mid sternal fracture Lungs/Pleura: Small bore LEFT thoracostomy tube. Small to moderate anterior LEFT pneumothorax despite thoracostomy tube. Atelectasis versus consolidation or contusion at the posterior lungs bilaterally greater on LEFT. No pleural effusion. No pulmonary mass. Tip of endotracheal tube within trachea in satisfactory position above carina. Musculoskeletal: Nondisplaced mid sternal fracture. No vertebral fractures identified. Mildly displaced fractures of lateral LEFT sixth seventh and ninth ribs. Nondisplaced fracture lateral LEFT eighth rib. Nondisplaced fractures of posterior RIGHT eleventh and twelfth ribs. Scattered degenerative disc disease changes of lower cervical and thoracic spine. Review of the MIP images confirms the above findings. CT ABDOMEN and PELVIS FINDINGS Hepatobiliary: Mild fatty infiltration of liver. Liver appears intact. Gallbladder contracted. Pancreas: Chronic calcific pancreatitis changes. No pancreatic mass or injury. Spleen: Small nonspecific low-attenuation focus at lateral spleen unchanged since prior CT, does not represent acute injury. Adrenals/Urinary Tract: BILATERAL adrenal gland thickening without discrete mass. Multiple LEFT renal cysts. No definite renal parenchymal injury or perinephric infiltration. Nondilated ureters. Bladder decompressed by Foley catheter. Stomach/Bowel: Minimal sigmoid diverticulosis. Stomach distended by food debris. Large and small bowel loops otherwise unremarkable. Appendix not visualized. Vascular/Lymphatic: Minimal sigmoid diverticulosis. Stomach distended by food debris. Third portion of the duodenum is incompletely distended, difficult to exclude subtle wall thickening/hematoma in this setting. Large and small bowel loops unremarkable Reproductive: Atherosclerotic calcifications aorta, iliac arteries, femoral arteries. No definite aortic injury  or aneurysm. Remaining vascular structures patent. Infiltrative changes are seen at the LEFT renal hilum, cannot exclude renal pedicle injury though the nephrograms appear grossly symmetric. Other: No free air. No hernia. Free fluid in RIGHT gutter extending into pelvis likely blood. Small amount of fluid in the prevesical space. Musculoskeletal: Nondisplaced fractures of posterior RIGHT eleventh and twelfth ribs. Displaced fractures RIGHT transverse processes of L1, L2, and L3. Nondisplaced fracture LEFT transverse process L1. Nondisplaced fracture RIGHT transverse process L4. Sacralized L5. Nondisplaced fracture through RIGHT transverse process of L4. Sacralized L5. Dissociation of L4-L5, distracted one point 6 cm posteriorly and 3.3 cm anteriorly. Additionally, superior endplate fracture of L1, displaced, with widening of T12-L1 disc space as well as interspinous distance of T12-L1 consistent with Chance fracture. No jumped facets. Pelvis appears intact. Subcutaneous contusion RIGHT posterior flank and posteriorly overlying the paraspinal muscles. Paraspinal hemorrhage involving the RIGHT greater than LEFT psoas muscles. Review of the MIP images confirms the above findings. IMPRESSION: Small bore LEFT thoracostomy tube with small to moderate anterior LEFT pneumothorax despite thoracostomy tube. Atelectasis versus consolidation or contusion at the posterior lungs bilaterally greater on LEFT. Infiltrative changes at the LEFT renal hilum, cannot exclude renal pedicle injury, though the nephrograms appear grossly symmetric. Retroperitoneal hematoma in the paraspinal regions with asymmetric hemorrhage at the RIGHT psoas muscle greater than LEFT, extending to the cross the diaphragm and into the retroperitoneum as far superiorly as the celiac origin. Displaced superior endplate fracture at L1 with widening of both T12-L1 disc space and interspinous distance of T12-L1 consistent with Chance fracture. Fractures of  transverse processes of L1, L2, L3, L4 RIGHT and L1 LEFT, displaced at RIGHT L1 L2 L3. No definite aortic injury is identified; paraspinal hemorrhage from numerous  fractures does extend adjacent to the aorta and in the retroperitoneum though the epicenter appears to be paraspinal rather than aortic. Cannot completely exclude subtle wall thickening of the third portion of the duodenum though this may be an artifact related underdistention. Minimal sigmoid diverticulosis. Splenic and LEFT renal cysts. Aortic Atherosclerosis (ICD10-I70.0). Critical Value/emergent results were called by telephone at the time of interpretation on 03/25/2021 at 2125 hours to provider DR. PAUL STECHSCHULTE , who verbally acknowledged these results. Electronically Signed   By: Lavonia Dana M.D.   On: 03/27/2021 21:42    Review of systems not obtained due to patient factors. Blood pressure (!) 103/57, pulse (!) 116, temperature 99.3 F (37.4 C), resp. rate (!) 34, height 5\' 11"  (1.803 m), weight 107.5 kg, SpO2 90 %. Patient is unconscious without evidence of awakening to noxious stimuli.  His pupils are pinpoint and nonreactive.  I cannot elicit corneal reflexes cough or gag.  No obvious movement of his extremities to noxious stimuli.  Examination his head demonstrates a significant forehead laceration as well as multiple areas of abrasion and contusion along his mid face and scalp.  No evidence of CSF otorrhea or rhinorrhea.  Neck is free from obvious injury.  Assessment/Plan: Severe multitrauma with profound hypotension and GCS of 3.  Given the patient's age and presumed medical history, overall chance of survival appears minimal.  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  He has sliding contusions along both medial frontal lobes.  There is no evidence of mass lesion.  There is no obvious evidence of intracranial hypertension or  herniation.  Patient with severe lumbar spinal injuries.  The patient has a severe distraction injury at L1 to and L5-S1 with complete disruption of the disc space and posterior elements of both levels.  There is significant paraspinal hemorrhage.  I worry there may be some degree of corresponding vascular injury.  The amount of energy required however this type of injury is tremendous.  At this point no indication for any operative intervention for his lumbar spinal injuries.  Cervical spine demonstrates significant degenerative spondylitic change but no evidence of fracture dislocation likewise thoracic spine with significant degenerative change but no fracture or dislocation.  Recommendations: Admit to ICU with optimal resuscitation and restoration of normal mean arterial pressure to improve cerebral perfusion.  Follow-up head CT scan tomorrow to evaluate for progression of contusions.  Bedrest for his lumbar injuries.  Should the patient survive the early stages of his injury then thoughts and plans will have to be made for spinal stabilization of his lumbar spine.  Cooper Render Melicia Esqueda 03/29/2021, 9:51 PM
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
## 2021-04-11 NOTE — ED Notes (Signed)
Unable to obtain accurate pulse oximetry pleth
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
## 2021-04-12 ENCOUNTER — Inpatient Hospital Stay (HOSPITAL_COMMUNITY): Payer: Medicare Other

## 2021-04-12 LAB — BPAM RBC
Blood Product Expiration Date: 202212032359
Blood Product Expiration Date: 202212032359
Blood Product Expiration Date: 202212212359
Blood Product Expiration Date: 202212212359
Blood Product Expiration Date: 202212262359
Blood Product Expiration Date: 202212262359
Blood Product Expiration Date: 202212262359
Blood Product Expiration Date: 202212262359
Blood Product Expiration Date: 202212262359
Blood Product Expiration Date: 202212262359
Blood Product Expiration Date: 202212262359
Blood Product Expiration Date: 202212272359
Blood Product Expiration Date: 202212272359
Blood Product Expiration Date: 202212272359
Blood Product Expiration Date: 202212272359
Blood Product Expiration Date: 202212272359
Blood Product Expiration Date: 202212282359
Blood Product Expiration Date: 202212282359
Blood Product Expiration Date: 202212282359
Blood Product Expiration Date: 202212282359
Blood Product Expiration Date: 202212282359
Blood Product Expiration Date: 202212282359
ISSUE DATE / TIME: 202211251939
ISSUE DATE / TIME: 202211251939
ISSUE DATE / TIME: 202211251947
ISSUE DATE / TIME: 202211251947
ISSUE DATE / TIME: 202211251947
ISSUE DATE / TIME: 202211251947
ISSUE DATE / TIME: 202211252011
ISSUE DATE / TIME: 202211252011
ISSUE DATE / TIME: 202211252011
ISSUE DATE / TIME: 202211252011
ISSUE DATE / TIME: 202211252020
ISSUE DATE / TIME: 202211252020
ISSUE DATE / TIME: 202211252020
ISSUE DATE / TIME: 202211252020
ISSUE DATE / TIME: 202211252038
ISSUE DATE / TIME: 202211252038
ISSUE DATE / TIME: 202211252038
ISSUE DATE / TIME: 202211252038
ISSUE DATE / TIME: 202211252215
ISSUE DATE / TIME: 202211252215
ISSUE DATE / TIME: 202211252215
ISSUE DATE / TIME: 202211252215
Unit Type and Rh: 5100
Unit Type and Rh: 5100
Unit Type and Rh: 5100
Unit Type and Rh: 5100
Unit Type and Rh: 5100
Unit Type and Rh: 5100
Unit Type and Rh: 5100
Unit Type and Rh: 5100
Unit Type and Rh: 5100
Unit Type and Rh: 5100
Unit Type and Rh: 5100
Unit Type and Rh: 5100
Unit Type and Rh: 5100
Unit Type and Rh: 5100
Unit Type and Rh: 5100
Unit Type and Rh: 5100
Unit Type and Rh: 5100
Unit Type and Rh: 5100
Unit Type and Rh: 5100
Unit Type and Rh: 5100
Unit Type and Rh: 5100
Unit Type and Rh: 5100

## 2021-04-12 LAB — BASIC METABOLIC PANEL
Anion gap: 29 — ABNORMAL HIGH (ref 5–15)
BUN: 16 mg/dL (ref 8–23)
BUN: 17 mg/dL (ref 8–23)
CO2: 9 mmol/L — ABNORMAL LOW (ref 22–32)
CO2: <7 mmol/L — ABNORMAL LOW (ref 22–32)
Calcium: 7.3 mg/dL — ABNORMAL LOW (ref 8.9–10.3)
Calcium: 7.2 mg/dL — ABNORMAL LOW (ref 8.9–10.3)
Chloride: 99 mmol/L (ref 98–111)
Chloride: 104 mmol/L (ref 98–111)
Creatinine, Ser: 2.44 mg/dL — ABNORMAL HIGH (ref 0.61–1.24)
Creatinine, Ser: 3.09 mg/dL — ABNORMAL HIGH (ref 0.61–1.24)
GFR, Estimated: 28 mL/min — ABNORMAL LOW (ref >60)
GFR, Estimated: 21 mL/min — ABNORMAL LOW (ref >60)
Glucose, Bld: 393 mg/dL — ABNORMAL HIGH (ref 70–99)
Glucose, Bld: 261 mg/dL — ABNORMAL HIGH (ref 70–99)
Potassium: 3.9 mmol/L (ref 3.5–5.1)
Potassium: 7 mmol/L (ref 3.5–5.1)
Sodium: 137 mmol/L (ref 135–145)
Sodium: 136 mmol/L (ref 135–145)

## 2021-04-12 LAB — LACTIC ACID, PLASMA
Lactic Acid, Venous: >9 mmol/L (ref 0.5–1.9)
Lactic Acid, Venous: >9 mmol/L (ref 0.5–1.9)
Lactic Acid, Venous: >9 mmol/L (ref 0.5–1.9)

## 2021-04-12 LAB — TYPE AND SCREEN
ABO/RH(D): A NEG
Antibody Screen: NEGATIVE
Unit division: 0
Unit division: 0
Unit division: 0
Unit division: 0
Unit division: 0
Unit division: 0
Unit division: 0
Unit division: 0
Unit division: 0
Unit division: 0
Unit division: 0
Unit division: 0
Unit division: 0
Unit division: 0
Unit division: 0
Unit division: 0
Unit division: 0
Unit division: 0
Unit division: 0
Unit division: 0
Unit division: 0
Unit division: 0

## 2021-04-12 LAB — URINALYSIS, ROUTINE W REFLEX MICROSCOPIC
Bilirubin Urine: NEGATIVE
Glucose, UA: >=500 mg/dL — AB
Ketones, ur: NEGATIVE mg/dL
Nitrite: NEGATIVE
Protein, ur: 100 mg/dL — AB
RBC / HPF: >50 RBC/hpf — ABNORMAL HIGH (ref 0–5)
Specific Gravity, Urine: 1.041 — ABNORMAL HIGH (ref 1.005–1.030)
WBC, UA: >50 WBC/hpf — ABNORMAL HIGH (ref 0–5)
pH: 5 (ref 5.0–8.0)

## 2021-04-12 LAB — CBC
HCT: 36.8 % — ABNORMAL LOW (ref 39.0–52.0)
HCT: 29.7 % — ABNORMAL LOW (ref 39.0–52.0)
HCT: 34 % — ABNORMAL LOW (ref 39.0–52.0)
Hemoglobin: 11.8 g/dL — ABNORMAL LOW (ref 13.0–17.0)
Hemoglobin: 9.2 g/dL — ABNORMAL LOW (ref 13.0–17.0)
Hemoglobin: 10.2 g/dL — ABNORMAL LOW (ref 13.0–17.0)
MCH: 29.2 pg (ref 26.0–34.0)
MCH: 29.5 pg (ref 26.0–34.0)
MCH: 30.2 pg (ref 26.0–34.0)
MCHC: 32.1 g/dL (ref 30.0–36.0)
MCHC: 31 g/dL (ref 30.0–36.0)
MCHC: 30 g/dL (ref 30.0–36.0)
MCV: 91.1 fL (ref 80.0–100.0)
MCV: 95.2 fL (ref 80.0–100.0)
MCV: 100.6 fL — ABNORMAL HIGH (ref 80.0–100.0)
Platelets: 182 10*3/uL (ref 150–400)
Platelets: 154 10*3/uL (ref 150–400)
Platelets: 122 10*3/uL — ABNORMAL LOW (ref 150–400)
RBC: 4.04 MIL/uL — ABNORMAL LOW (ref 4.22–5.81)
RBC: 3.12 MIL/uL — ABNORMAL LOW (ref 4.22–5.81)
RBC: 3.38 MIL/uL — ABNORMAL LOW (ref 4.22–5.81)
RDW: 15.2 % (ref 11.5–15.5)
RDW: 15.9 % — ABNORMAL HIGH (ref 11.5–15.5)
RDW: 16.5 % — ABNORMAL HIGH (ref 11.5–15.5)
WBC: 20.5 10*3/uL — ABNORMAL HIGH (ref 4.0–10.5)
WBC: 14.5 10*3/uL — ABNORMAL HIGH (ref 4.0–10.5)
WBC: 19.4 10*3/uL — ABNORMAL HIGH (ref 4.0–10.5)
nRBC: 0 % (ref 0.0–0.2)
nRBC: 0.1 % (ref 0.0–0.2)
nRBC: 0.5 % — ABNORMAL HIGH (ref 0.0–0.2)

## 2021-04-12 LAB — DIC (DISSEMINATED INTRAVASCULAR COAGULATION)PANEL
D-Dimer, Quant: >20 ug/mL-FEU — ABNORMAL HIGH (ref 0.00–0.50)
Fibrinogen: 186 mg/dL — ABNORMAL LOW (ref 210–475)
INR: 1.4 — ABNORMAL HIGH (ref 0.8–1.2)
Platelets: 177 10*3/uL (ref 150–400)
Prothrombin Time: 17.5 seconds — ABNORMAL HIGH (ref 11.4–15.2)
Smear Review: NONE SEEN
aPTT: 37 seconds — ABNORMAL HIGH (ref 24–36)

## 2021-04-12 LAB — POCT I-STAT 7, (LYTES, BLD GAS, ICA,H+H)
Acid-base deficit: 6 mmol/L — ABNORMAL HIGH (ref 0.0–2.0)
Acid-base deficit: 7 mmol/L — ABNORMAL HIGH (ref 0.0–2.0)
Bicarbonate: 22.1 mmol/L (ref 20.0–28.0)
Bicarbonate: 20.6 mmol/L (ref 20.0–28.0)
Calcium, Ion: 0.95 mmol/L — ABNORMAL LOW (ref 1.15–1.40)
Calcium, Ion: 1.07 mmol/L — ABNORMAL LOW (ref 1.15–1.40)
HCT: 41 % (ref 39.0–52.0)
HCT: 35 % — ABNORMAL LOW (ref 39.0–52.0)
Hemoglobin: 13.9 g/dL (ref 13.0–17.0)
Hemoglobin: 11.9 g/dL — ABNORMAL LOW (ref 13.0–17.0)
O2 Saturation: 93 %
O2 Saturation: 82 %
Patient temperature: 36.1
Patient temperature: 36.8
Potassium: 3.2 mmol/L — ABNORMAL LOW (ref 3.5–5.1)
Potassium: 3.4 mmol/L — ABNORMAL LOW (ref 3.5–5.1)
Sodium: 139 mmol/L (ref 135–145)
Sodium: 139 mmol/L (ref 135–145)
TCO2: 24 mmol/L (ref 22–32)
TCO2: 22 mmol/L (ref 22–32)
pCO2 arterial: 53.2 mmHg — ABNORMAL HIGH (ref 32.0–48.0)
pCO2 arterial: 50.7 mmHg — ABNORMAL HIGH (ref 32.0–48.0)
pH, Arterial: 7.221 — ABNORMAL LOW (ref 7.350–7.450)
pH, Arterial: 7.216 — ABNORMAL LOW (ref 7.350–7.450)
pO2, Arterial: 77 mmHg — ABNORMAL LOW (ref 83.0–108.0)
pO2, Arterial: 56 mmHg — ABNORMAL LOW (ref 83.0–108.0)

## 2021-04-12 LAB — PREPARE CRYOPRECIPITATE: Unit division: 0

## 2021-04-12 LAB — HEPATIC FUNCTION PANEL
ALT: 37 U/L (ref 0–44)
AST: 148 U/L — ABNORMAL HIGH (ref 15–41)
Albumin: 2 g/dL — ABNORMAL LOW (ref 3.5–5.0)
Alkaline Phosphatase: 119 U/L (ref 38–126)
Bilirubin, Direct: 0.3 mg/dL — ABNORMAL HIGH (ref 0.0–0.2)
Indirect Bilirubin: 0.9 mg/dL (ref 0.3–0.9)
Total Bilirubin: 1.2 mg/dL (ref 0.3–1.2)
Total Protein: 3.8 g/dL — ABNORMAL LOW (ref 6.5–8.1)

## 2021-04-12 LAB — CK: Total CK: 5208 U/L — ABNORMAL HIGH (ref 49–397)

## 2021-04-12 LAB — BPAM PLATELET PHERESIS
Blood Product Expiration Date: 202211282359
ISSUE DATE / TIME: 202211252057
Unit Type and Rh: 6200

## 2021-04-12 LAB — BPAM CRYOPRECIPITATE
Blood Product Expiration Date: 202211260238
ISSUE DATE / TIME: 202211252101
Unit Type and Rh: 6200

## 2021-04-12 LAB — GLUCOSE, CAPILLARY
Glucose-Capillary: 215 mg/dL — ABNORMAL HIGH (ref 70–99)
Glucose-Capillary: 228 mg/dL — ABNORMAL HIGH (ref 70–99)
Glucose-Capillary: 255 mg/dL — ABNORMAL HIGH (ref 70–99)
Glucose-Capillary: 278 mg/dL — ABNORMAL HIGH (ref 70–99)
Glucose-Capillary: 286 mg/dL — ABNORMAL HIGH (ref 70–99)
Glucose-Capillary: 312 mg/dL — ABNORMAL HIGH (ref 70–99)
Glucose-Capillary: 376 mg/dL — ABNORMAL HIGH (ref 70–99)

## 2021-04-12 LAB — PREPARE PLATELET PHERESIS: Unit division: 0

## 2021-04-12 LAB — ETHANOL: Alcohol, Ethyl (B): <10 mg/dL (ref <10)

## 2021-04-12 LAB — BLOOD PRODUCT ORDER (VERBAL) VERIFICATION

## 2021-04-12 LAB — TRIGLYCERIDES: Triglycerides: 101 mg/dL (ref <150)

## 2021-04-12 LAB — PROTIME-INR
INR: 3.3 — ABNORMAL HIGH (ref 0.8–1.2)
Prothrombin Time: 33.9 seconds — ABNORMAL HIGH (ref 11.4–15.2)

## 2021-04-12 LAB — MRSA NEXT GEN BY PCR, NASAL: MRSA by PCR Next Gen: NOT DETECTED

## 2021-04-12 LAB — CREATININE, SERUM
Creatinine, Ser: 1.78 mg/dL — ABNORMAL HIGH (ref 0.61–1.24)
GFR, Estimated: 41 mL/min — ABNORMAL LOW (ref >60)

## 2021-04-12 LAB — APTT: aPTT: 103 seconds — ABNORMAL HIGH (ref 24–36)

## 2021-04-12 LAB — HIV ANTIBODY (ROUTINE TESTING W REFLEX): HIV Screen 4th Generation wRfx: NONREACTIVE

## 2021-04-12 LAB — PREPARE RBC (CROSSMATCH)

## 2021-04-12 MED ORDER — DOCUSATE SODIUM 50 MG/5ML PO LIQD: 100.0000 mg | Freq: Two times a day (BID) | ORAL | Status: DC

## 2021-04-12 MED ORDER — PROPOFOL 500 MG/50ML IV EMUL
INTRAVENOUS | Status: DC | PRN
  Administered 2021-04-11: 50 ug/kg/min via INTRAVENOUS

## 2021-04-12 MED ORDER — PROPOFOL 1000 MG/100ML IV EMUL: 0.0000 ug/kg/min | INTRAVENOUS | Status: DC

## 2021-04-12 MED ORDER — FENTANYL BOLUS VIA INFUSION
25.0000 ug | INTRAVENOUS | Status: DC | PRN
  Filled 2021-04-12: qty 100

## 2021-04-12 MED ORDER — MIDAZOLAM HCL 2 MG/2ML IJ SOLN: 1.0000 mg | INTRAMUSCULAR | Status: DC | PRN

## 2021-04-12 MED ORDER — SODIUM CHLORIDE 0.9 % IV SOLN: INTRAVENOUS | Status: DC

## 2021-04-12 MED ORDER — ONDANSETRON HCL 4 MG/2ML IJ SOLN: 4.0000 mg | Freq: Four times a day (QID) | INTRAMUSCULAR | Status: DC | PRN

## 2021-04-12 MED ORDER — FENTANYL CITRATE PF 50 MCG/ML IJ SOSY: 25.0000 ug | PREFILLED_SYRINGE | INTRAMUSCULAR | Status: DC | PRN

## 2021-04-12 MED ORDER — ORAL CARE MOUTH RINSE
15.0000 mL | OROMUCOSAL | Status: DC
  Administered 2021-04-12 (×6): 15 mL via OROMUCOSAL

## 2021-04-12 MED ORDER — EPINEPHRINE 1 MG/10ML IJ SOSY
PREFILLED_SYRINGE | INTRAMUSCULAR | Status: AC
  Filled 2021-04-12: qty 20

## 2021-04-12 MED ORDER — CALCIUM GLUCONATE-NACL 2-0.675 GM/100ML-% IV SOLN
2.0000 g | Freq: Once | INTRAVENOUS | Status: AC
  Administered 2021-04-12: 2000 mg via INTRAVENOUS
  Filled 2021-04-12: qty 100

## 2021-04-12 MED ORDER — SODIUM CHLORIDE 0.9% IV SOLUTION: Freq: Once | INTRAVENOUS | Status: AC

## 2021-04-12 MED ORDER — POLYETHYLENE GLYCOL 3350 17 G PO PACK: 17.0000 g | PACK | Freq: Every day | ORAL | Status: DC

## 2021-04-12 MED ORDER — ENOXAPARIN SODIUM 30 MG/0.3ML IJ SOSY: 30.0000 mg | PREFILLED_SYRINGE | Freq: Two times a day (BID) | INTRAMUSCULAR | Status: DC

## 2021-04-12 MED ORDER — FENTANYL CITRATE PF 50 MCG/ML IJ SOSY: 25.0000 ug | PREFILLED_SYRINGE | Freq: Once | INTRAMUSCULAR | Status: DC

## 2021-04-12 MED ORDER — OXYCODONE HCL 5 MG/5ML PO SOLN: 10.0000 mg | ORAL | Status: DC | PRN

## 2021-04-12 MED ORDER — DOCUSATE SODIUM 100 MG PO CAPS
100.0000 mg | ORAL_CAPSULE | Freq: Two times a day (BID) | ORAL | Status: DC
Start: 2021-04-12 — End: 2021-04-12

## 2021-04-12 MED ORDER — INSULIN PUMP
SUBCUTANEOUS | Status: DC
  Filled 2021-04-12: qty 1

## 2021-04-12 MED ORDER — NOREPINEPHRINE 4 MG/250ML-% IV SOLN
0.0000 ug/min | INTRAVENOUS | Status: DC
  Administered 2021-04-12 (×7): 40 ug/min via INTRAVENOUS
  Filled 2021-04-12 (×7): qty 250

## 2021-04-12 MED ORDER — LACTATED RINGERS IV BOLUS
1000.0000 mL | Freq: Once | INTRAVENOUS | Status: AC
  Administered 2021-04-12: 1000 mL via INTRAVENOUS

## 2021-04-12 MED ORDER — NOREPINEPHRINE 4 MG/250ML-% IV SOLN
INTRAVENOUS | Status: AC
  Filled 2021-04-12: qty 250

## 2021-04-12 MED ORDER — CHLORHEXIDINE GLUCONATE CLOTH 2 % EX PADS: 6.0000 | MEDICATED_PAD | Freq: Every day | CUTANEOUS | Status: DC

## 2021-04-12 MED ORDER — PHENYLEPHRINE HCL-NACL 20-0.9 MG/250ML-% IV SOLN
0.0000 ug/min | INTRAVENOUS | Status: DC
  Administered 2021-04-12 (×10): 400 ug/min via INTRAVENOUS
  Administered 2021-04-12: 200 ug/min via INTRAVENOUS
  Administered 2021-04-12 (×5): 400 ug/min via INTRAVENOUS
  Filled 2021-04-12 (×15): qty 250

## 2021-04-12 MED ORDER — DEXTROSE 50 % IV SOLN: 0.0000 mL | INTRAVENOUS | Status: DC | PRN

## 2021-04-12 MED ORDER — CHLORHEXIDINE GLUCONATE 0.12% ORAL RINSE (MEDLINE KIT)
15.0000 mL | Freq: Two times a day (BID) | OROMUCOSAL | Status: DC
  Administered 2021-04-12 (×2): 15 mL via OROMUCOSAL

## 2021-04-12 MED ORDER — DEXTROSE IN LACTATED RINGERS 5 % IV SOLN: INTRAVENOUS | Status: DC

## 2021-04-12 MED ORDER — VASOPRESSIN 20 UNITS/100 ML INFUSION FOR SHOCK
0.0000 [IU]/min | INTRAVENOUS | Status: DC
  Administered 2021-04-12 (×2): 0.03 [IU]/min via INTRAVENOUS
  Filled 2021-04-12 (×2): qty 100

## 2021-04-12 MED ORDER — INSULIN ASPART 100 UNIT/ML IJ SOLN
0.0000 [IU] | INTRAMUSCULAR | Status: DC
  Administered 2021-04-12: 15 [IU] via SUBCUTANEOUS
  Administered 2021-04-12: 11 [IU] via SUBCUTANEOUS

## 2021-04-12 MED ORDER — IOHEXOL 350 MG/ML SOLN
100.0000 mL | Freq: Once | INTRAVENOUS | Status: AC | PRN
  Administered 2021-04-12: 100 mL via INTRAVENOUS

## 2021-04-12 MED ORDER — LACTATED RINGERS IV SOLN: INTRAVENOUS | Status: DC

## 2021-04-12 MED ORDER — INSULIN REGULAR(HUMAN) IN NACL 100-0.9 UT/100ML-% IV SOLN
INTRAVENOUS | Status: DC
  Administered 2021-04-12: 10 [IU]/h via INTRAVENOUS

## 2021-04-12 MED ORDER — PROCHLORPERAZINE EDISYLATE 10 MG/2ML IJ SOLN: 10.0000 mg | INTRAMUSCULAR | Status: DC | PRN

## 2021-04-12 MED ORDER — FENTANYL 2500MCG IN NS 250ML (10MCG/ML) PREMIX INFUSION
25.0000 ug/h | INTRAVENOUS | Status: DC
  Administered 2021-04-12: 50 ug/h via INTRAVENOUS
  Filled 2021-04-12: qty 250

## 2021-04-13 LAB — BPAM RBC
Blood Product Expiration Date: 202211262359
Blood Product Expiration Date: 202212032359
ISSUE DATE / TIME: 202211260822
ISSUE DATE / TIME: 202211260822
Unit Type and Rh: 600
Unit Type and Rh: 600

## 2021-04-13 LAB — TYPE AND SCREEN
ABO/RH(D): A NEG
Antibody Screen: NEGATIVE
Unit division: 0
Unit division: 0

## 2021-04-14 ENCOUNTER — Encounter (HOSPITAL_COMMUNITY): Payer: Self-pay | Admitting: Surgery

## 2021-04-14 ENCOUNTER — Encounter (HOSPITAL_COMMUNITY): Payer: Self-pay | Admitting: Emergency Medicine

## 2021-04-14 LAB — PREPARE FRESH FROZEN PLASMA
Unit division: 0
Unit division: 0
Unit division: 0
Unit division: 0
Unit division: 0
Unit division: 0
Unit division: 0
Unit division: 0
Unit division: 0
Unit division: 0
Unit division: 0
Unit division: 0
Unit division: 0

## 2021-04-14 LAB — BPAM FFP
Blood Product Expiration Date: 202211272359
Blood Product Expiration Date: 202211272359
Blood Product Expiration Date: 202211282359
Blood Product Expiration Date: 202211282359
Blood Product Expiration Date: 202211282359
Blood Product Expiration Date: 202211282359
Blood Product Expiration Date: 202211282359
Blood Product Expiration Date: 202211302359
Blood Product Expiration Date: 202211302359
Blood Product Expiration Date: 202211302359
Blood Product Expiration Date: 202211302359
Blood Product Expiration Date: 202211302359
Blood Product Expiration Date: 202211302359
Blood Product Expiration Date: 202211302359
Blood Product Expiration Date: 202211302359
Blood Product Expiration Date: 202212012359
Blood Product Expiration Date: 202212012359
Blood Product Expiration Date: 202212132359
Blood Product Expiration Date: 202212142359
ISSUE DATE / TIME: 202211251947
ISSUE DATE / TIME: 202211251947
ISSUE DATE / TIME: 202211251947
ISSUE DATE / TIME: 202211251947
ISSUE DATE / TIME: 202211252011
ISSUE DATE / TIME: 202211252011
ISSUE DATE / TIME: 202211252011
ISSUE DATE / TIME: 202211252011
ISSUE DATE / TIME: 202211252020
ISSUE DATE / TIME: 202211252020
ISSUE DATE / TIME: 202211252020
ISSUE DATE / TIME: 202211252038
ISSUE DATE / TIME: 202211252038
ISSUE DATE / TIME: 202211252038
ISSUE DATE / TIME: 202211252038
ISSUE DATE / TIME: 202211252224
ISSUE DATE / TIME: 202211252224
ISSUE DATE / TIME: 202211252224
ISSUE DATE / TIME: 202211252224
Unit Type and Rh: 6200
Unit Type and Rh: 6200
Unit Type and Rh: 6200
Unit Type and Rh: 6200
Unit Type and Rh: 6200
Unit Type and Rh: 6200
Unit Type and Rh: 6200
Unit Type and Rh: 6200
Unit Type and Rh: 6200
Unit Type and Rh: 6200
Unit Type and Rh: 6200
Unit Type and Rh: 6200
Unit Type and Rh: 6200
Unit Type and Rh: 6200
Unit Type and Rh: 6200
Unit Type and Rh: 6200
Unit Type and Rh: 6200
Unit Type and Rh: 6200
Unit Type and Rh: 6200

## 2021-04-14 LAB — HEMOGLOBIN A1C
Hgb A1c MFr Bld: 6.6 % — ABNORMAL HIGH (ref 4.8–5.6)
Hgb A1c MFr Bld: 6.6 % — ABNORMAL HIGH (ref 4.8–5.6)
Mean Plasma Glucose: 143 mg/dL
Mean Plasma Glucose: 143 mg/dL

## 2021-04-17 NOTE — Progress Notes (Signed)
Attempted to reach son, Suzy Bouchard at [phone].  Rings for a while but no answer or answering machine.  Will try again in the morning.

## 2021-04-17 NOTE — Transfer of Care (Signed)
Immediate Anesthesia Transfer of Care Note  Patient: Ethan Clayburn Picha  Procedure(s) Performed: EXPLORATORY LAPAROTOMY, REPAIR OF MESENTERIC INJURY (Abdomen)  Patient Location: NICU  Anesthesia Type:General  Level of Consciousness: Patient remains intubated per anesthesia plan  Airway & Oxygen Therapy: Patient placed on Ventilator (see vital sign flow sheet for setting)  Post-op Assessment: Report given to RN and Post -op Vital signs reviewed and stable  Post vital signs: Reviewed and stable  Last Vitals:  Vitals Value Taken Time  BP    Temp 36.2 C 04/23/21 0000  Pulse 127 2021-04-23 0006  Resp 13 04/23/2021 0006  SpO2 85 % 23-Apr-2021 0006  Vitals shown include unvalidated device data.  Last Pain: There were no vitals filed for this visit.       Complications: No notable events documented.

## 2021-04-17 NOTE — Progress Notes (Signed)
Date and time results received: 07-May-2021 0155  Test: Lactic Acid Critical Value: >9  Name of Provider Notified: Trauma MD Stechschulte  Orders Received? Or Actions Taken?: No new orders received  RN will continue to monitor.  Wyn Quaker, RN

## 2021-04-17 NOTE — Progress Notes (Addendum)
Attempted to reach son, Corene Cornea, again at [phone].  Rings for a while but no answer or answering machine.  Patient is becoming harder to support.  CTA results reviewed with radiology team.  He is in no condition for transport or intervention at this point.  Need to discuss goals of care with family, will continue to try to reach them.  Lower extremities cold without capillary refill, RUE and LUE cool with sluggish capillary refill.  Likely in large part due to triple pressor support.  Left eye non-reactive, right eye sluggish reaction to light.  Heart rate slowing, neurogenic shock may be driving recent drop in blood pressure and lack of response to levo, vaso and phenylephrine, or severe lactic acidosis.    We will continue to attempt to contact family.  Felicie Morn, MD

## 2021-04-17 NOTE — Progress Notes (Signed)
..  Trauma Response Nurse Note- Message left for daughter that is listed -- Shahzaib Azevedo [phone] Attempted to contact son- Mcgwire Dasaro at [phone] Attempted to contact brother - mailbox is full -- [phone]  Unable to contact any family.   Dr, Kieth Brightly aware, at bedside.   Rolene Arbour, RN  Trauma Response Nurse [phone]

## 2021-04-17 NOTE — Progress Notes (Signed)
[place] notified by this RN due to patient's absence of corneal, cough, and gag reflexes as well as GCS of 3. Spoke with [employer]. Referral number is [id number].   Spoke with Customer service manager and they are following case. Will call with any updates to patient or changes to code status the family makes, if able to be contacted.   Wyn Quaker, RN

## 2021-04-17 NOTE — Progress Notes (Signed)
Belongings (shoes, jacket, jeans, socks, underwear, +all other patient belongings) except for $300 sent to the safe was sent with Shanon Payor - Badge number [phone] with [place] police.   Wyn Quaker, RN
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
## 2021-04-17 NOTE — Progress Notes (Signed)
This RN attempted to call patient's son, daughter in law, and brother. No response. Voice mail messages left.   Wyn Quaker, RN

## 2021-04-17 NOTE — Progress Notes (Addendum)
Pt time of death 24. Dr. Kieth Brightly notified. Family decision to make Douglas Carns [phone] point of contact for follow up questions and funeral home arrangements.
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
## 2021-04-17 NOTE — TOC CAGE-AID Note (Signed)
Transition of Care [facility]) - CAGE-AID Screening   Patient Details  Name: Brentton Wardlow MRN: [id number] Date of Birth: 1955-06-06  [facility] CM/SW Contact:    Army Melia, RN Phone Number:[phone] Apr 23, 2021, 12:50 AM   Clinical Narrative:  Patient intubated, unable to participate in screening at this time.  CAGE-AID Screening: Substance Abuse Screening unable to be completed due to: : Patient unable to participate

## 2021-04-17 NOTE — ED Notes (Signed)
Pt vomited around OG and ET tube. OG removed per MD

## 2021-04-17 NOTE — Progress Notes (Addendum)
This RN spoke with Pt's daughter. She explained that she as well as her brothers are all estranged from they patient. She explained that she and her siblings are aware of Pt's status at this time and she and her brothers have given permission to Pt's sister in law to make decisions regarding care.   RN spoke with Pt's sister in law Reba and she states that Pt had expressed DNR wishes to her in the past and that they had been working on health care POA paperwork but nothing was ever signed.   RN spoke with Pt's ex wife who is mother of Pt's other 4 children. Her 4 children are also estranged from the patient and it was stated to RN that they would not go against other children's wishes whatever they might be.

## 2021-04-17 NOTE — Anesthesia Postprocedure Evaluation (Signed)
Anesthesia Post Note  Patient: Douglas Melendez  Procedure(s) Performed: EXPLORATORY LAPAROTOMY, REPAIR OF MESENTERIC INJURY (Abdomen)     Patient location during evaluation: SICU Anesthesia Type: General Level of consciousness: sedated Pain management: pain level controlled Vital Signs Assessment: post-procedure vital signs reviewed and stable Respiratory status: patient remains intubated per anesthesia plan Cardiovascular status: unstable Postop Assessment: no apparent nausea or vomiting Anesthetic complications: no   No notable events documented.  Last Vitals:  Vitals:   04/30/21 1300 04/30/21 1400  BP: (!) 57/41 (!) 54/33  Pulse: 68 63  Resp: 20 20  Temp: (!) 35.4 C (!) 35.6 C  SpO2: 91% 91%    Last Pain:  Vitals:   April 30, 2021 1119  TempSrc: Bladder                 Belenda Cruise P Kenyetta Fife

## 2021-04-17 NOTE — Progress Notes (Signed)
Patient is having oxygenation issues. Patient remaining on 100% and +5PEEP. No introduction of more PEEP therapy due to chest tube placement left midaxillary. Pulmonary Airway Pressure are stable at this time. Lactate is critical noted to be >9.  Patient was transported to and from CT w/o complications. Uneventful trip.   Alfio Loescher L. Tamala Julian, BS, RRT-ACCS, RCP
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
## 2021-04-17 NOTE — Progress Notes (Addendum)
Per Elmyra Ricks (nurse) - 8 estranged children who have given decision making ability to sister in law. Sister in law not currently answering phone, but in last conversation with nurse, sister in law stated the patient's wishes align with DNR.  We will make the patient DNR.
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
## 2021-04-17 NOTE — Progress Notes (Signed)
Attempted to reach son at the number listed in the chart multiple times. There is no voice message setup. Will continue to attempt to call.

## 2021-04-17 NOTE — Progress Notes (Signed)
Trauma MD Ste[place]ul[place] paged by this RN due to patient's hypotension despite being maxed out on levophed, neo, and vasopressin.   Unable to contact family to change code status.  New orders received: administer 2L bolus of NS.   RN will continue to monitor.  Wyn Quaker, RN

## 2021-04-17 NOTE — Progress Notes (Signed)
Reviewed CT images with vascular surgeon Dr. Virl Cagey.  Flow is intact to distal extremities on CTA.  Fractures and pressor requirements likely contributing to exam findings. If fractures set or pressors able to be weaned pulses would likely improve.  Unfortunately, it does not appear he will be able to undergo an operation or be able to be weaned from pressors.   Called patient's son Corene Cornea [phone] again just now in attempt to update him.  No answer.  Felicie Morn, MD
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
## 2021-04-17 DEATH — deceased

## 2021-05-18 NOTE — Progress Notes (Signed)
Acute blood loss anemia
